# Patient Record
Sex: Female | Born: 1980 | Race: White | Hispanic: No | Marital: Single | Smoking: Former smoker
Health system: Southern US, Community
[De-identification: ages and names within clinical notes are randomized; demographics above are authoritative.]

## PROBLEM LIST (undated history)

## (undated) DIAGNOSIS — I4949 Other premature depolarization: Secondary | ICD-10-CM

## (undated) DIAGNOSIS — G90A Postural orthostatic tachycardia syndrome (POTS): Secondary | ICD-10-CM

## (undated) DIAGNOSIS — I499 Cardiac arrhythmia, unspecified: Secondary | ICD-10-CM

## (undated) DIAGNOSIS — R55 Syncope and collapse: Secondary | ICD-10-CM

## (undated) DIAGNOSIS — F341 Dysthymic disorder: Secondary | ICD-10-CM

## (undated) DIAGNOSIS — I498 Other specified cardiac arrhythmias: Secondary | ICD-10-CM

## (undated) DIAGNOSIS — G43009 Migraine without aura, not intractable, without status migrainosus: Secondary | ICD-10-CM

## (undated) DIAGNOSIS — J029 Acute pharyngitis, unspecified: Secondary | ICD-10-CM

## (undated) DIAGNOSIS — J019 Acute sinusitis, unspecified: Secondary | ICD-10-CM

## (undated) HISTORY — DX: Other specified cardiac arrhythmias: I49.8

## (undated) HISTORY — DX: Migraine without aura, not intractable, without status migrainosus: G43.009

## (undated) HISTORY — DX: Dysthymic disorder: F34.1

## (undated) HISTORY — DX: Postural orthostatic tachycardia syndrome (POTS): G90.A

## (undated) HISTORY — DX: Syncope and collapse: R55

## (undated) HISTORY — DX: Other premature depolarization: I49.49

## (undated) HISTORY — DX: Acute sinusitis, unspecified: J01.90

## (undated) HISTORY — PX: KNEE ARTHROSCOPY: SHX127

## (undated) HISTORY — DX: Acute pharyngitis, unspecified: J02.9

## (undated) HISTORY — DX: Cardiac arrhythmia, unspecified: I49.9

---
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
## 2002-11-03 ENCOUNTER — Emergency Department (HOSPITAL_COMMUNITY): Admission: EM | Admit: 2002-11-03 | Discharge: 2002-11-04 | Payer: Self-pay | Admitting: Emergency Medicine

## 2004-03-03 ENCOUNTER — Emergency Department (HOSPITAL_COMMUNITY): Admission: EM | Admit: 2004-03-03 | Discharge: 2004-03-03 | Payer: Self-pay | Admitting: *Deleted

## 2004-05-19 ENCOUNTER — Inpatient Hospital Stay (HOSPITAL_COMMUNITY): Admission: AD | Admit: 2004-05-19 | Discharge: 2004-05-19 | Payer: Self-pay | Admitting: Obstetrics & Gynecology

## 2004-05-21 ENCOUNTER — Inpatient Hospital Stay (HOSPITAL_COMMUNITY): Admission: AD | Admit: 2004-05-21 | Discharge: 2004-05-21 | Payer: Self-pay | Admitting: *Deleted

## 2004-06-26 ENCOUNTER — Inpatient Hospital Stay (HOSPITAL_COMMUNITY): Admission: AD | Admit: 2004-06-26 | Discharge: 2004-06-26 | Payer: Self-pay | Admitting: Obstetrics and Gynecology

## 2004-06-29 ENCOUNTER — Inpatient Hospital Stay (HOSPITAL_COMMUNITY): Admission: AD | Admit: 2004-06-29 | Discharge: 2004-06-29 | Payer: Self-pay | Admitting: Family Medicine

## 2004-07-10 ENCOUNTER — Inpatient Hospital Stay (HOSPITAL_COMMUNITY): Admission: AD | Admit: 2004-07-10 | Discharge: 2004-07-10 | Payer: Self-pay | Admitting: Obstetrics and Gynecology

## 2004-10-03 ENCOUNTER — Inpatient Hospital Stay (HOSPITAL_COMMUNITY): Admission: AD | Admit: 2004-10-03 | Discharge: 2004-10-03 | Payer: Self-pay | Admitting: Obstetrics & Gynecology

## 2004-10-05 ENCOUNTER — Inpatient Hospital Stay (HOSPITAL_COMMUNITY): Admission: AD | Admit: 2004-10-05 | Discharge: 2004-10-05 | Payer: Self-pay | Admitting: *Deleted

## 2004-10-10 ENCOUNTER — Inpatient Hospital Stay (HOSPITAL_COMMUNITY): Admission: AD | Admit: 2004-10-10 | Discharge: 2004-10-11 | Payer: Self-pay | Admitting: *Deleted

## 2004-10-17 ENCOUNTER — Inpatient Hospital Stay (HOSPITAL_COMMUNITY): Admission: RE | Admit: 2004-10-17 | Discharge: 2004-10-17 | Payer: Self-pay | Admitting: Obstetrics and Gynecology

## 2004-11-11 ENCOUNTER — Other Ambulatory Visit: Admission: RE | Admit: 2004-11-11 | Discharge: 2004-11-11 | Payer: Self-pay | Admitting: Obstetrics & Gynecology

## 2004-11-12 ENCOUNTER — Ambulatory Visit (HOSPITAL_COMMUNITY): Admission: RE | Admit: 2004-11-12 | Discharge: 2004-11-12 | Payer: Self-pay | Admitting: Obstetrics & Gynecology

## 2004-11-12 ENCOUNTER — Other Ambulatory Visit: Admission: RE | Admit: 2004-11-12 | Discharge: 2004-11-12 | Payer: Self-pay | Admitting: Obstetrics & Gynecology

## 2004-11-12 ENCOUNTER — Encounter (INDEPENDENT_AMBULATORY_CARE_PROVIDER_SITE_OTHER): Payer: Self-pay | Admitting: *Deleted

## 2005-03-24 ENCOUNTER — Inpatient Hospital Stay (HOSPITAL_COMMUNITY): Admission: AD | Admit: 2005-03-24 | Discharge: 2005-03-25 | Payer: Self-pay | Admitting: Obstetrics and Gynecology

## 2005-04-14 ENCOUNTER — Inpatient Hospital Stay (HOSPITAL_COMMUNITY): Admission: AD | Admit: 2005-04-14 | Discharge: 2005-04-14 | Payer: Self-pay | Admitting: Obstetrics and Gynecology

## 2005-06-01 ENCOUNTER — Inpatient Hospital Stay (HOSPITAL_COMMUNITY): Admission: AD | Admit: 2005-06-01 | Discharge: 2005-06-01 | Payer: Self-pay | Admitting: Obstetrics and Gynecology

## 2005-08-30 ENCOUNTER — Inpatient Hospital Stay (HOSPITAL_COMMUNITY): Admission: AD | Admit: 2005-08-30 | Discharge: 2005-08-30 | Payer: Self-pay | Admitting: Obstetrics and Gynecology

## 2005-09-17 ENCOUNTER — Observation Stay (HOSPITAL_COMMUNITY): Admission: AD | Admit: 2005-09-17 | Discharge: 2005-09-18 | Payer: Self-pay | Admitting: Internal Medicine

## 2005-10-06 ENCOUNTER — Inpatient Hospital Stay (HOSPITAL_COMMUNITY): Admission: AD | Admit: 2005-10-06 | Discharge: 2005-10-07 | Payer: Self-pay | Admitting: Obstetrics and Gynecology

## 2005-10-16 ENCOUNTER — Inpatient Hospital Stay (HOSPITAL_COMMUNITY): Admission: AD | Admit: 2005-10-16 | Discharge: 2005-10-19 | Payer: Self-pay | Admitting: Obstetrics & Gynecology

## 2005-10-16 ENCOUNTER — Encounter (INDEPENDENT_AMBULATORY_CARE_PROVIDER_SITE_OTHER): Payer: Self-pay | Admitting: Specialist

## 2006-05-28 ENCOUNTER — Emergency Department (HOSPITAL_COMMUNITY): Admission: EM | Admit: 2006-05-28 | Discharge: 2006-05-28 | Payer: Self-pay | Admitting: Emergency Medicine

## 2006-06-10 ENCOUNTER — Emergency Department (HOSPITAL_COMMUNITY): Admission: EM | Admit: 2006-06-10 | Discharge: 2006-06-10 | Payer: Self-pay | Admitting: Emergency Medicine

## 2006-10-11 ENCOUNTER — Emergency Department: Payer: Self-pay | Admitting: Emergency Medicine

## 2006-10-12 ENCOUNTER — Emergency Department (HOSPITAL_COMMUNITY): Admission: EM | Admit: 2006-10-12 | Discharge: 2006-10-12 | Payer: Self-pay | Admitting: Emergency Medicine

## 2006-10-28 ENCOUNTER — Ambulatory Visit (HOSPITAL_BASED_OUTPATIENT_CLINIC_OR_DEPARTMENT_OTHER): Admission: RE | Admit: 2006-10-28 | Discharge: 2006-10-28 | Payer: Self-pay | Admitting: Otolaryngology

## 2006-12-12 IMAGING — US US OB TRANSVAGINAL
1 series · 14 of 28 positions shown · non-contrast
Comparison: none

CLINICAL DATA: Confirm viability.  Multiple prior spontaneous abortions.  
 TRANSVAGINAL OBSTETRICAL ULTRASOUND:  
 Multiple images of the uterus and adnexa were obtained using an endovaginal approach.
 There is a single intrauterine pregnancy identified that demonstrates an estimated gestational age by crown rump length of 7 weeks and 4 days.  Positive regular fetal cardiac activity with a rate of 157 bpm was noted.  A normal appearing yolk sac is seen and no signs of subchorionic hemorrhage are evident.
 Both ovaries are seen and have a normal appearance with the right ovary measuring 3.6 x 2.0 x 2.0 cm and containing a corpus luteum cyst.  The left ovary measures 2.7 x 1.4 x 1.9 cm and has a normal appearance.  No cul-de-sac or periovarian fluid is seen and no separate adnexal masses are noted.

[Series 1: us ob transvaginal · 0.21mm/px · 14 of 52 slices shown]
[im 2/52]
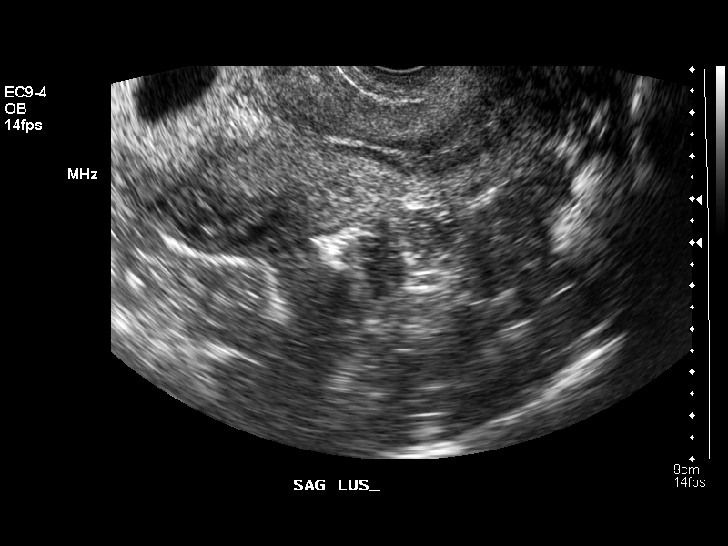
[im 6/52]
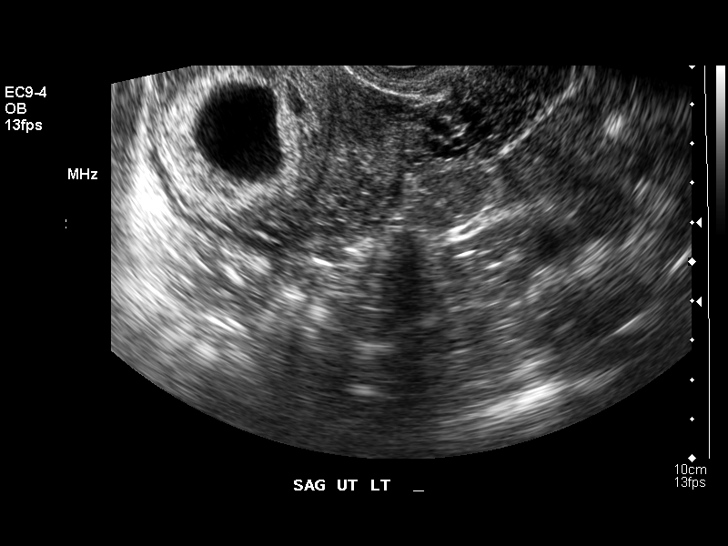
[im 10/52]
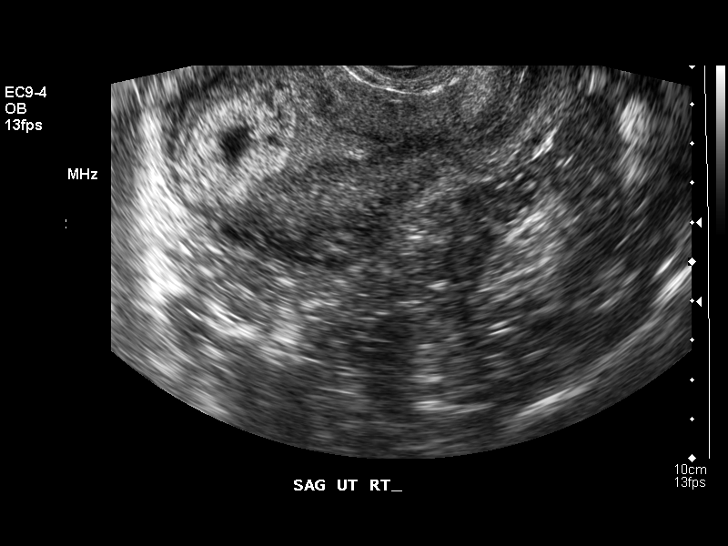
[im 14/52]
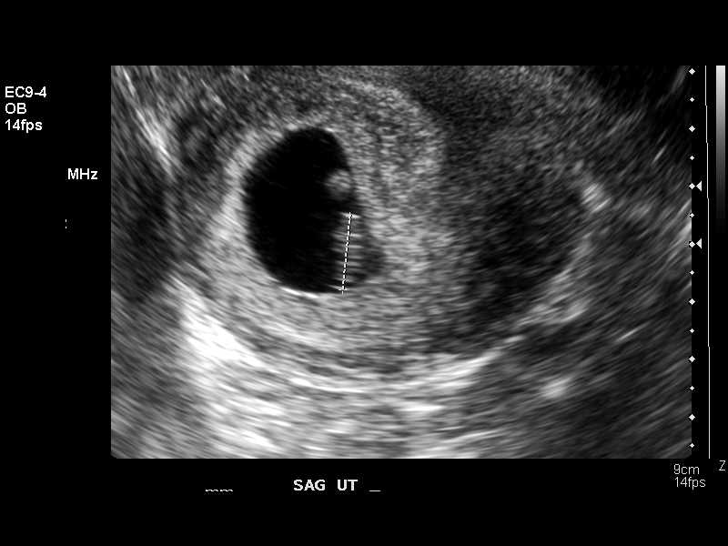
[im 18/52]
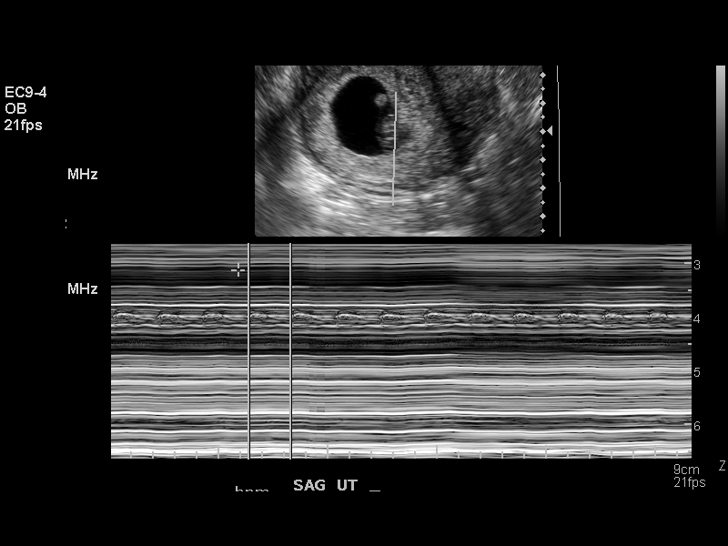
[im 21/52]
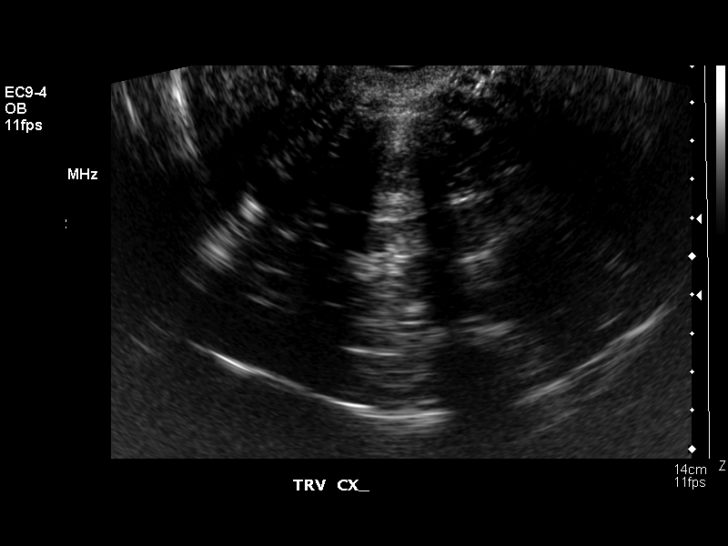
[im 25/52]
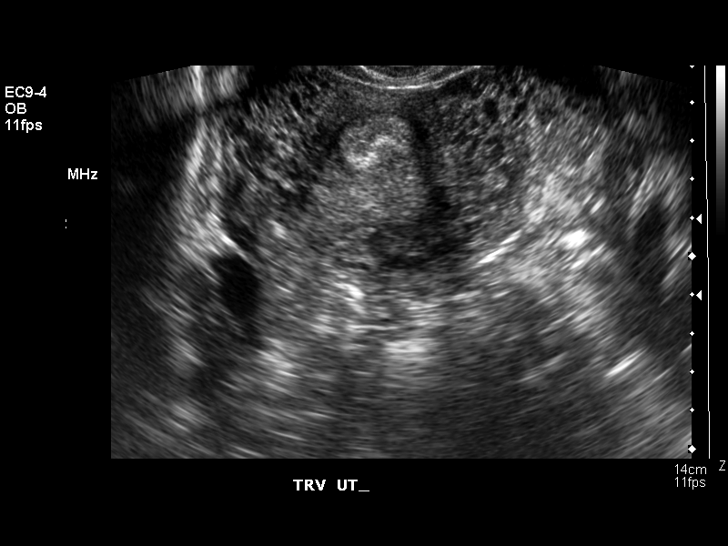
[im 29/52]
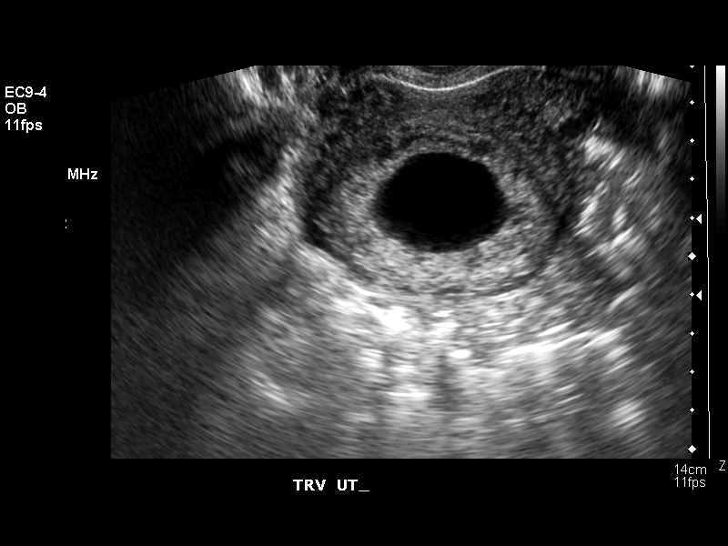
[im 33/52]
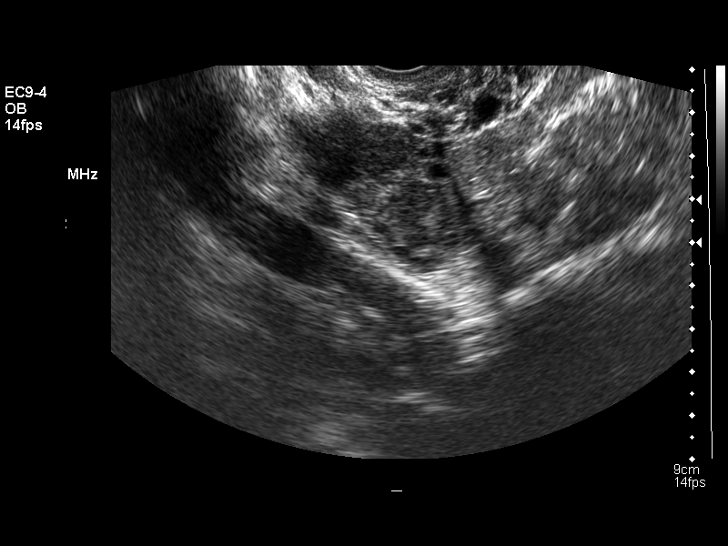
[im 36/52]
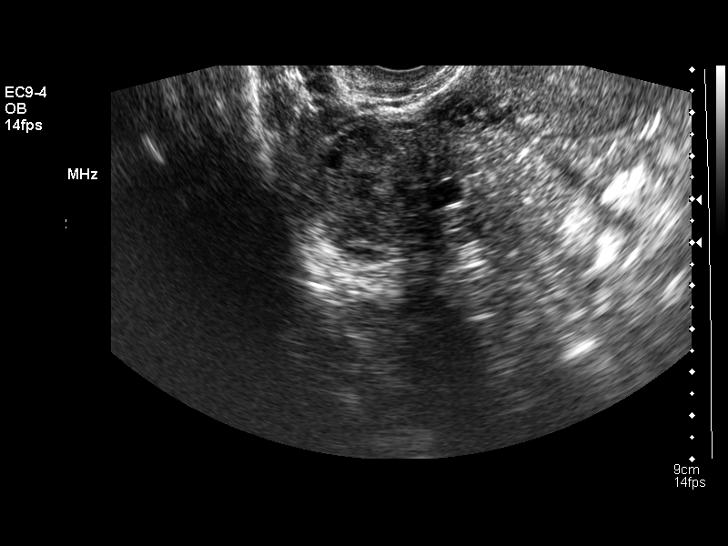
[im 40/52]
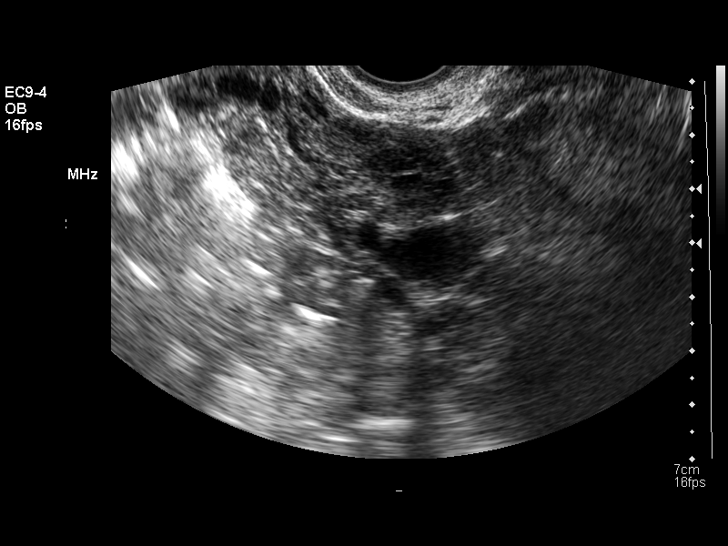
[im 44/52]
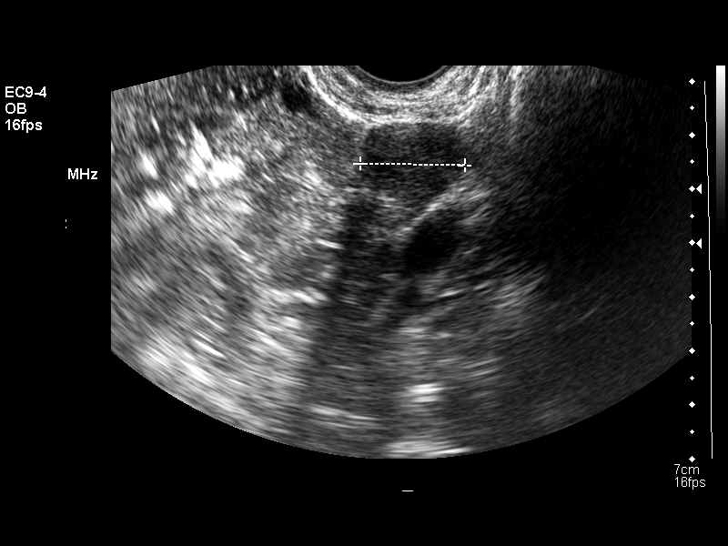
[im 48/52]
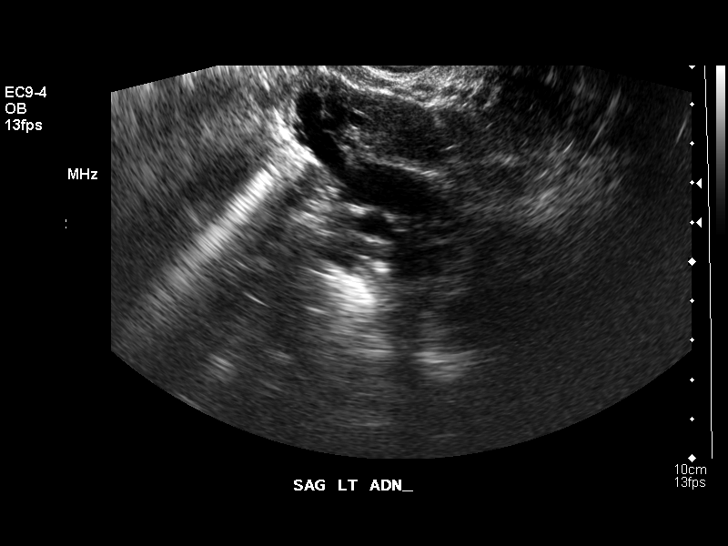
[im 52/52]
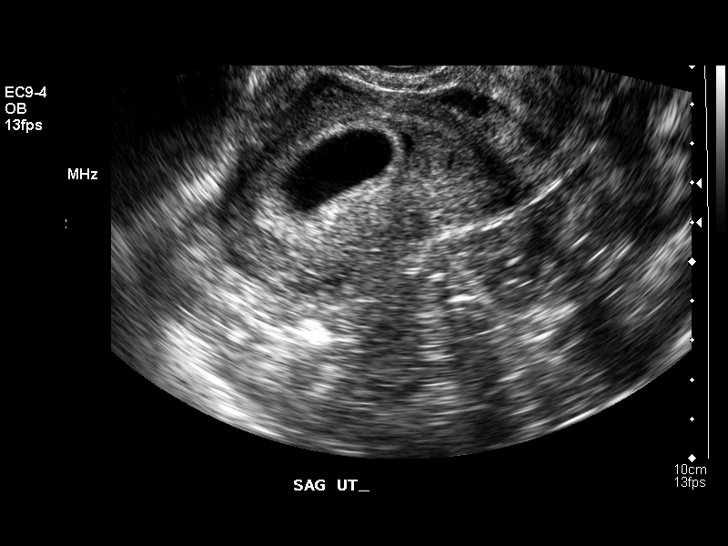

[14 of 28 positions shown; findings below may reference images not displayed]

IMPRESSION: 7 week 4 day living intrauterine pregnancy.  Normal ovaries.

## 2007-04-22 HISTORY — PX: OTHER SURGICAL HISTORY: SHX169

## 2008-09-19 ENCOUNTER — Emergency Department (HOSPITAL_COMMUNITY): Admission: EM | Admit: 2008-09-19 | Discharge: 2008-09-19 | Payer: Self-pay | Admitting: Emergency Medicine

## 2008-12-14 ENCOUNTER — Encounter: Admission: RE | Admit: 2008-12-14 | Discharge: 2008-12-14 | Payer: Self-pay | Admitting: Obstetrics and Gynecology

## 2009-01-02 ENCOUNTER — Ambulatory Visit: Payer: Self-pay | Admitting: Internal Medicine

## 2009-01-02 DIAGNOSIS — R55 Syncope and collapse: Secondary | ICD-10-CM | POA: Insufficient documentation

## 2009-01-02 DIAGNOSIS — G43009 Migraine without aura, not intractable, without status migrainosus: Secondary | ICD-10-CM

## 2009-01-02 DIAGNOSIS — F341 Dysthymic disorder: Secondary | ICD-10-CM

## 2009-01-02 HISTORY — DX: Migraine without aura, not intractable, without status migrainosus: G43.009

## 2009-01-02 HISTORY — DX: Syncope and collapse: R55

## 2009-01-02 HISTORY — DX: Dysthymic disorder: F34.1

## 2009-01-09 ENCOUNTER — Ambulatory Visit: Payer: Self-pay

## 2009-01-09 ENCOUNTER — Encounter: Payer: Self-pay | Admitting: Internal Medicine

## 2009-01-12 ENCOUNTER — Ambulatory Visit: Payer: Self-pay | Admitting: Internal Medicine

## 2009-01-12 DIAGNOSIS — I499 Cardiac arrhythmia, unspecified: Secondary | ICD-10-CM

## 2009-01-12 HISTORY — DX: Cardiac arrhythmia, unspecified: I49.9

## 2009-01-15 ENCOUNTER — Ambulatory Visit: Payer: Self-pay

## 2009-02-19 ENCOUNTER — Ambulatory Visit: Payer: Self-pay | Admitting: Internal Medicine

## 2009-02-21 ENCOUNTER — Ambulatory Visit: Payer: Self-pay | Admitting: Internal Medicine

## 2009-02-21 DIAGNOSIS — I4949 Other premature depolarization: Secondary | ICD-10-CM

## 2009-02-21 HISTORY — DX: Other premature depolarization: I49.49

## 2009-06-04 ENCOUNTER — Emergency Department (HOSPITAL_COMMUNITY): Admission: EM | Admit: 2009-06-04 | Discharge: 2009-06-04 | Payer: Self-pay | Admitting: Emergency Medicine

## 2009-07-12 ENCOUNTER — Telehealth: Payer: Self-pay | Admitting: Internal Medicine

## 2009-08-15 ENCOUNTER — Ambulatory Visit: Payer: Self-pay | Admitting: Internal Medicine

## 2009-08-15 DIAGNOSIS — J019 Acute sinusitis, unspecified: Secondary | ICD-10-CM

## 2009-08-15 HISTORY — DX: Acute sinusitis, unspecified: J01.90

## 2010-01-23 ENCOUNTER — Telehealth: Payer: Self-pay | Admitting: Internal Medicine

## 2010-03-23 ENCOUNTER — Ambulatory Visit: Payer: Self-pay | Admitting: Internal Medicine

## 2010-03-23 DIAGNOSIS — J029 Acute pharyngitis, unspecified: Secondary | ICD-10-CM

## 2010-03-23 HISTORY — DX: Acute pharyngitis, unspecified: J02.9

## 2010-03-23 LAB — CONVERTED CEMR LAB: Rapid Strep: NEGATIVE

## 2010-05-19 LAB — CONVERTED CEMR LAB
Alkaline Phosphatase: 46 units/L (ref 39–117)
CO2: 30 meq/L (ref 19–32)
Chloride: 107 meq/L (ref 96–112)
Creatinine, Ser: 0.8 mg/dL (ref 0.4–1.2)
GFR calc non Af Amer: 91.67 mL/min (ref >60)
Glucose, Bld: 105 mg/dL — ABNORMAL HIGH (ref 70–99)
Lymphocytes Relative: 43 % (ref 12.0–46.0)
Lymphs Abs: 2.4 10*3/uL (ref 0.7–4.0)
MCHC: 33.8 g/dL (ref 30.0–36.0)
MCV: 97.5 fL (ref 78.0–100.0)
Monocytes Relative: 8.3 % (ref 3.0–12.0)
Neutrophils Relative %: 47.8 % (ref 43.0–77.0)
Potassium: 4.1 meq/L (ref 3.5–5.1)
TSH: 1.06 microintl units/mL (ref 0.35–5.50)
Total Protein: 8.1 g/dL (ref 6.0–8.3)
WBC: 5.5 10*3/uL (ref 4.5–10.5)

## 2010-05-21 NOTE — Progress Notes (Signed)
Summary: alprazolam  Phone Note Call from Patient Call back at Home Phone [phone]   Caller: Patient Reason for Call: Refill Medication Summary of Call: Pt is requesting refill on Alprazolam to be sent to [facility] Initial call taken by: Brenton Grills MA,  January 23, 2010 4:50 PM    New/Updated Medications: ALPRAZOLAM 0.25 MG TABS (ALPRAZOLAM) 1 by mouth once daily as needed Prescriptions: ALPRAZOLAM 0.25 MG TABS (ALPRAZOLAM) 1 by mouth once daily as needed  #30 x 2   Entered and Authorized by:   Corwin Levins MD   Signed by:   Corwin Levins MD on 01/23/2010   Method used:   Print then Give to Patient   RxID:   [id number]  done hardcopy to LIM side B - dahlia Corwin Levins MD  January 23, 2010 5:46 PM   Rx faxed to pharmacy Margaret Pyle, CMA  January 24, 2010 7:55 AM

## 2010-05-21 NOTE — Assessment & Plan Note (Signed)
Summary: COLD SYMPTOMS---STC   Vital Signs:  Patient profile:   30 year old female Height:      67 inches Weight:      120 pounds BMI:     18.86 O2 Sat:      97 % on Room air Temp:     98.1 degrees F oral Pulse rate:   82 / minute BP sitting:   100 / 58  (left arm) Cuff size:   regular  Vitals Entered ByZella Ball Ewing (August 15, 2009 4:36 PM)  O2 Flow:  Room air CC: congestion, ear pain, sore throat/RE   Primary Care Provider:  Dr. Jonny Ruiz has not seen in 5 years  CC:  congestion, ear pain, and sore throat/RE.  History of Present Illness: here with 3 days onset facial pain, pressure, fever , and greenish d/c with mild ST, but Pt denies CP, sob, doe, wheezing, orthopnea, pnd, worsening LE edema, palps, dizziness or syncope   Pt denies new neuro symptoms such as headache, facial or extremity weakness     Problems Prior to Update: 1)  Sinusitis- Acute-nos  (ICD-461.9) 2)  Premature Ventricular Contractions  (ICD-427.69) 3)  Cardiac Arrhythmia  (ICD-427.9) 4)  Common Migraine  (ICD-346.10) 5)  Anxiety Depression  (ICD-300.4) 6)  Syncope  (ICD-780.2)  Medications Prior to Update: 1)  Sertraline Hcl 50 Mg Tabs (Sertraline Hcl) .Marland Kitchen.. 1 By Mouth Once Daily 2)  Alprazolam 0.25 Mg Tabs (Alprazolam) .Marland Kitchen.. 1 By Mouth Once Daily As Needed 3)  Azithromycin 250 Mg Tabs (Azithromycin) .... 2po Qd For 1 Day, Then 1po Qd For 4days, Then Stop  Current Medications (verified): 1)  Sertraline Hcl 50 Mg Tabs (Sertraline Hcl) .Marland Kitchen.. 1 By Mouth Once Daily 2)  Alprazolam 0.25 Mg Tabs (Alprazolam) .Marland Kitchen.. 1 By Mouth Once Daily As Needed 3)  Cephalexin 500 Mg Caps (Cephalexin) .Marland Kitchen.. 1 By Mouth Three Times A Day 4)  Sudahist 12-120 Mg Xr12h-Tab (Chlorpheniramine-Pseudoeph) .Marland Kitchen.. 1 By Mouth Two Times A Day As Needed  Allergies (verified): 1)  ! * Avelox  Past History:  Past Medical History: Last updated: 01/02/2009 migraine  Past Surgical History: Last updated: 01/02/2009 s/p rhinoplasty  2009  Social History: Last updated: 01/02/2009 Single 2 children - twins work - Building services engineer support Former Smoker Alcohol use-yes - rare  Risk Factors: Smoking Status: quit (01/02/2009)  Review of Systems       all otherwise negative per pt -    Physical Exam  General:  alert and well-developed.  , mild ill  Head:  normocephalic and atraumatic.   Eyes:  vision grossly intact, pupils equal, and pupils round.   Ears:  bilat tm's red, sinus tender bilat  Nose:  nasal dischargemucosal pallor and mucosal edema.   Mouth:  pharyngeal erythema and fair dentition.   Neck:  supple and cervical lymphadenopathy.   Lungs:  normal respiratory effort and normal breath sounds.   Heart:  normal rate and regular rhythm.   Extremities:  no edema, no erythema    Impression & Recommendations:  Problem # 1:  SINUSITIS- ACUTE-NOS (ICD-461.9)  Her updated medication list for this problem includes:    Cephalexin 500 Mg Caps (Cephalexin) .Marland Kitchen... 1 by mouth three times a day    Sudahist 12-120 Mg Xr12h-tab (Chlorpheniramine-pseudoeph) .Marland Kitchen... 1 by mouth two times a day as needed treat as above, f/u any worsening signs or symptoms   Complete Medication List: 1)  Sertraline Hcl 50 Mg Tabs (Sertraline hcl) .Marland Kitchen.. 1 by mouth once  daily 2)  Alprazolam 0.25 Mg Tabs (Alprazolam) .Marland Kitchen.. 1 by mouth once daily as needed 3)  Cephalexin 500 Mg Caps (Cephalexin) .Marland Kitchen.. 1 by mouth three times a day 4)  Sudahist 12-120 Mg Xr12h-tab (Chlorpheniramine-pseudoeph) .Marland Kitchen.. 1 by mouth two times a day as needed  Patient Instructions: 1)  Please take all new medications as prescribed 2)  Continue all previous medications as before this visit  3)  Please schedule a follow-up appointment as needed. Prescriptions: SUDAHIST 12-120 MG XR12H-TAB (CHLORPHENIRAMINE-PSEUDOEPH) 1 by mouth two times a day as needed  #60 x 1   Entered and Authorized by:   Corwin Levins MD   Signed by:   Corwin Levins MD on 08/15/2009   Method used:    Print then Give to Patient   RxID:   [id number] CEPHALEXIN 500 MG CAPS (CEPHALEXIN) 1 by mouth three times a day  #30 x 0   Entered and Authorized by:   Corwin Levins MD   Signed by:   Corwin Levins MD on 08/15/2009   Method used:   Print then Give to Patient   RxID:   [id number]

## 2010-05-21 NOTE — Progress Notes (Signed)
Summary: Med Refill  Phone Note Refill Request  on July 12, 2009 2:17 PM  Refills Requested: Medication #1:  ALPRAZOLAM 0.25 MG TABS 1 by mouth once daily as needed-rarely   Dosage confirmed as above?Dosage Confirmed   Notes: [facility] [phone] Initial call taken by: Scharlene Gloss,  July 12, 2009 2:17 PM  Follow-up for Phone Call        RX faxed to pharmacy Follow-up by: Margaret Pyle, CMA,  July 13, 2009 8:11 AM    New/Updated Medications: ALPRAZOLAM 0.25 MG TABS (ALPRAZOLAM) 1 by mouth once daily as needed Prescriptions: ALPRAZOLAM 0.25 MG TABS (ALPRAZOLAM) 1 by mouth once daily as needed  #30 x 2   Entered and Authorized by:   Corwin Levins MD   Signed by:   Corwin Levins MD on 07/12/2009   Method used:   Print then Give to Patient   RxID:   [id number]  done hardcopy to LIM side B - dahlia  Corwin Levins MD  July 12, 2009 5:53 PM

## 2010-05-21 NOTE — Assessment & Plan Note (Signed)
Summary: SORE THROAT/STREP/DLO   Vital Signs:  Patient profile:   30 year old female Weight:      118 pounds BMI:     18.55 Temp:     97.5 degrees F oral Pulse rate:   77 / minute BP sitting:   90 / 58  (left arm) Cuff size:   regular  Vitals Entered By: Lamar Sprinkles, CMA (March 23, 2010 9:57 AM) CC: severe sore throat Comments Pt has stopped gen zoloft/SD   History of Present Illness: CC: ST  3d h/o bad ST, mild congestion.  Hsa tried nothing so far.  Getting plenty of fluid.  No fevers/chills, no coughing.  No HA, abd pain, n/v, rashes, myalgias, arthralgias.  son has viral cold, neg strep  self titrated off zoloft - didn't like jittery feeling.  slowly backed off xanax as well.  Current Medications (verified): 1)  Sertraline Hcl 50 Mg Tabs (Sertraline Hcl) .Marland Kitchen.. 1 By Mouth Once Daily 2)  Alprazolam 0.25 Mg Tabs (Alprazolam) .Marland Kitchen.. 1 By Mouth Once Daily As Needed  Allergies (verified): 1)  ! * Avelox  Past History:  Past Medical History: Last updated: 01/02/2009 migraine  Social History: Last updated: 01/02/2009 Single 2 children - twins work - Building services engineer support Former Smoker Alcohol use-yes - rare  Review of Systems       per HPI  Physical Exam  General:  alert and well-developed.  tired Head:  normocephalic and atraumatic.    sinuses NT Eyes:  vision grossly intact, pupils equal, and pupils round.   Ears:  Tms clear bilaterally Nose:  nares clear Mouth:  pharyngeal erythema and fair dentition.  no exudates  Neck:  supple and tender R AC LAD Lungs:  normal respiratory effort and normal breath sounds.   Heart:  normal rate and regular rhythm.   Abdomen:  soft, non-tender, and normal bowel sounds.   Pulses:  2+ rad pulses Extremities:  no edema, no erythema  Skin:  Intact without suspicious lesions or rashes   Impression & Recommendations:  Problem # 1:  ACUTE PHARYNGITIS (ICD-462) rapid strep neg, low centor criteria.  treat  supportively.  red flags to return discussed The following medications were removed from the medication list:    Cephalexin 500 Mg Caps (Cephalexin) .Marland Kitchen... 1 by mouth three times a day  Orders: Rapid Strep (54098)  Complete Medication List: 1)  Sertraline Hcl 50 Mg Tabs (Sertraline hcl) .Marland Kitchen.. 1 by mouth once daily 2)  Alprazolam 0.25 Mg Tabs (Alprazolam) .Marland Kitchen.. 1 by mouth once daily as needed  Patient Instructions: 1)  Looks like a viral infection.  Wash hands to prevent spreading. 2)  Push fluids, get plenty of rest, ibuprofen (motrin) for sore throat.  Suck on cold things like popsicles, or heat to soothe throat like herbal teas, consider salt water gargles. 3)  If fever >101.5, trouble swallowing or breathing or opening mouth, drooling, or other concerns, you may need to return to be seen. 4)  Pleasure to see you today, call clinic with questions.    Orders Added: 1)  Est. Patient Level III [11914] 2)  Rapid Strep [78295]    Laboratory Results    Other Tests  Rapid Strep: negative

## 2010-07-29 LAB — RAPID STREP SCREEN (MED CTR MEBANE ONLY): Streptococcus, Group A Screen (Direct): POSITIVE — AB
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
## 2010-09-06 NOTE — Op Note (Signed)
NAMEMARILEE, Kathy Mccoy               ACCOUNT NO.:  [id number]   MEDICAL RECORD NO.:  [id number]          PATIENT TYPE:  AMB   LOCATION:  SDC                           [facility]   PHYSICIAN:  Ilda Mori, M.D.   DATE OF BIRTH:  September 26, 1980   DATE OF PROCEDURE:  11/12/2004  DATE OF DISCHARGE:                                 OPERATIVE REPORT   PREOPERATIVE DIAGNOSIS:  Missed abortion.   POSTOPERATIVE DIAGNOSIS:  Missed abortion.   PROCEDURE:  Dilatation and evacuation.   SURGEON:  Ilda Mori, M.D.   ANESTHESIA:  Paracervical block with IV sedation.   ESTIMATED BLOOD LOSS:  10 mL.   FINDINGS:  Products of conception.   SPECIMENS:  Products of conception to pathology and portion of products of  conception for chromosomal analysis.   COMPLICATIONS:  None.   INDICATIONS:  This is a 30 year old gravida 4, AB 3, who presented to the  office at 1 weeks' gestation and was found to have an intrauterine fetal  demise of approximately 10 weeks' size.  The findings were confirmed with a  second ultrasound, and I discussed with the patient, who elected to proceed  with dilatation and evacuation.   PROCEDURE:  The patient was taken to the operating room and placed in supine  position.  IV sedation was administered.  She was placed in the dorsal  lithotomy position.  The vagina and vulva were prepped and draped in a  sterile fashion and a speculum was placed in the vagina.  The cervix was  identified.  Lidocaine 1% 20 mL was infiltrated in the paracervical tissues.  The anterior lip of the cervix grasped with a single-tooth tenaculum.  The  internal os was dilated with Shawnie Pons dilators to 52 [place].  A #9 suction  curette was introduced into the endometrial cavity and the products of  conception were evacuated.  The specimen was then evaluated to see if fetal  parts could be sent for chromosomal analysis, and no fetal parts could be  seen.  A second exploration of the uterine  cavity was performed with a #7  suction curette and there was no remaining tissue in the cavity, and the  cavity was felt to be  thoroughly explored.  A specimen of placental tissue was sent for a  chromosomal analysis, and it was felt that the fetal tissue was so autolyzed  from the previous demise that during the suction, it became unidentifiable.  At this point the procedure was terminated.  The patient left the operating  room in good condition.       RK/MEDQ  D:  11/12/2004  T:  11/12/2004  Job:  [id number]

## 2010-09-06 NOTE — Op Note (Signed)
Kathy Mccoy, CHRISTON NO.:  [id number]   MEDICAL RECORD NO.:  [id number]          PATIENT TYPE:  INP   LOCATION:  9125                          [facility]ACILITY:  [facility]   PHYSICIAN:  Malva Limes, M.D.    DATE OF BIRTH:  1981-03-20   DATE OF PROCEDURE:  10/16/2005  DATE OF DISCHARGE:  10/19/2005                                 OPERATIVE REPORT   HISTORY OF PRESENT ILLNESS:  [name] is a 30 year old white female, G-4, P-0-  0-3-0 at 36-1/2 weeks estimated gestational age, who presented to Labor and  Delivery with a twin pregnancy, in active labor.  At the time of admission,  the patient was 3 cm dilated.  The presentation of the babies were vertex  and breech.  The patient was admitted and amniotomy performed.  She  progressed long and normally over curve without difficulty.  When she  completed the first stage she was taken to the operative room.  She had a  spontaneous vaginal delivery in the vertex presentation over a first degree  midline tear without difficulty.  After this, an ultrasound was used to  locate baby B.  Baby B was in the frank breech presentation.  At this point  both feet was grasped.  The amniotic sac ruptured.  Fluid was clear.  A  breech extraction was performed without difficulty.  Once this was  accomplished, both infants were handed to the NICU team and then taken to  newborn nursery.  Cord was obtained.  Placenta was spontaneously delivered  and sent to Pathology.  The first great care was closed with 3-0 chromic in  the usual fashion.  The patient was taken to recovery room in stable  condition.           ______________________________  Malva Limes, M.D.     MA/MEDQ  D:  11/10/2005  T:  11/10/2005  Job:  [id number]

## 2010-09-06 NOTE — Discharge Summary (Signed)
[name], Kathy Mccoy               ACCOUNT NO.:  [id number]   MEDICAL RECORD NO.:  [id number]          PATIENT TYPE:  INP   LOCATION:  9125                          [facility]ILITY[facility]   PHYSICIAN:  Randye Lobo, M.D.   DATE OF BIRTH:  [birth year]   DATE OF ADMISSION:  10/16/2005  DATE OF DISCHARGE:  10/19/2005                                 DISCHARGE SUMMARY   FINAL DIAGNOSIS:  Intrauterine twin gestation at 36-1/2 weeks' gestation,  vertex and breech presentation, active labor, vaginal deliveries performed  by Dr. Malva Limes.   COMPLICATIONS:  None.   HISTORY:  This 30 year old G30, P-0-0-3-0 presents at 36-1/2-weeks' gestation  with twin pregnancy in active labor.  The patient's antepartum course up to  this point had been complicated by a history of three spontaneous  miscarriages and some preterm contractions.  The patient was on Procardia up  until about 35-[redacted] weeks gestation.  Otherwise, her antepartum course had  been uncomplicated.  The patient was about 3 cm dilated at time of  admission.  The babies were still in a vertex breech presentation.  Amniotomy was performed.  The patient had dilated to complete.  She had  spontaneous vaginal delivery of baby A in the vertex position.  Baby A  weighed 5 pounds 0 ounces with Apgars of 9 and 9.  There was a first degree  midline tear that was present, and ultrasound was then used to locate baby B  which was in a frank breech presentation.  At this point, both feet were  grasped by Dr. Dareen Piano.  The amniotic sac was ruptured, and a breech  extraction was performed without difficulty.  Baby B weighed 5 pounds 5  ounces and was a female infant with Apgars of 6 and 9.  The delivery went  without complications.  The NICU team was there, but the babies needed just  to go to the newborn nursery.  The delivery went without complications.  The  patient's postpartum course was benign without any significant fevers.  There were some feeding  issues, and therefore the patient was kept in-house  until postpartum day #3.  She was felt ready for discharge at this time.  She was sent home on a regular diet with decrease activities, told to  continue her prenatal vitamins, could use ibuprofen up to 600 mg every 6  hours as needed for pain, was to call of course with any increased bleeding,  pain or problems.  The patient is a single mother but did have some help  with care of twins.   DISCHARGE LABORATORY DATA:  The patient had a hemoglobin of 12.4, white  blood cell count of 11.2, platelets of 156,000 and a normal PIH panel with  only a slightly elevated uric acid to 7.18.      Kathy Mccoy, P.A.-C.      Randye Lobo, M.D.  Electronically Signed    MB/MEDQ  D:  12/08/2005  T:  12/08/2005  Job:  [id number]
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
## 2010-11-07 ENCOUNTER — Ambulatory Visit (INDEPENDENT_AMBULATORY_CARE_PROVIDER_SITE_OTHER): Payer: 59 | Admitting: Internal Medicine

## 2010-11-07 ENCOUNTER — Encounter: Payer: Self-pay | Admitting: Internal Medicine

## 2010-11-07 DIAGNOSIS — H6091 Unspecified otitis externa, right ear: Secondary | ICD-10-CM | POA: Insufficient documentation

## 2010-11-07 DIAGNOSIS — Z01810 Encounter for preprocedural cardiovascular examination: Secondary | ICD-10-CM

## 2010-11-07 DIAGNOSIS — J019 Acute sinusitis, unspecified: Secondary | ICD-10-CM

## 2010-11-07 DIAGNOSIS — F341 Dysthymic disorder: Secondary | ICD-10-CM

## 2010-11-07 DIAGNOSIS — H60399 Other infective otitis externa, unspecified ear: Secondary | ICD-10-CM

## 2010-11-07 DIAGNOSIS — Z0000 Encounter for general adult medical examination without abnormal findings: Secondary | ICD-10-CM

## 2010-11-07 HISTORY — DX: Encounter for preprocedural cardiovascular examination: Z01.810

## 2010-11-07 MED ORDER — AZITHROMYCIN 250 MG PO TABS: ORAL_TABLET | ORAL | Status: AC

## 2010-11-07 MED ORDER — NEOMYCIN-POLYMYXIN-HC 3.5-10000-1 OT SUSP: 3.0000 [drp] | Freq: Four times a day (QID) | OTIC | Status: AC

## 2010-11-07 MED ORDER — ALPRAZOLAM 0.25 MG PO TABS: 0.2500 mg | ORAL_TABLET | Freq: Every day | ORAL | Status: AC | PRN

## 2010-11-07 NOTE — Assessment & Plan Note (Signed)
Mild to mod, for antibx course,  to f/u any worsening symptoms or concerns 

## 2010-11-07 NOTE — Progress Notes (Signed)
  Subjective:    Patient ID: Kathy Mccoy, female    DOB: 08-19-1980, 30 y.o.   MRN: [id number]  HPI  Here with 3 days acute onset fever, facial pain, pressure, general weakness and malaise, and greenish d/c, with slight ST, but little to no cough and Pt denies chest pain, increased sob or doe, wheezing, orthopnea, PND, increased LE swelling, palpitations, dizziness or syncope. Also incidentally with 2-3 days right ear pain, swelling, tender without hearing loss, trauma, or d/c.  Denies worsening depressive symptoms, suicidal ideation, or panic, though has ongoing anxiety, not increased recently, though did try the zoloft at 50 mg that seemed to make her morejittery to was stopped.  Currently using minimal xanax more for near-panic only. Past Medical History  Diagnosis Date  . Acute pharyngitis 03/23/2010  . ANXIETY DEPRESSION 01/02/2009  . CARDIAC ARRHYTHMIA 01/12/2009  . COMMON MIGRAINE 01/02/2009  . PREMATURE VENTRICULAR CONTRACTIONS 02/21/2009  . SINUSITIS- ACUTE-NOS 08/15/2009  . SYNCOPE 01/02/2009   Past Surgical History  Procedure Date  . S/p rhinoplasty 2009    reports that she has quit smoking. She does not have any smokeless tobacco history on file. She reports that she drinks alcohol. Her drug history not on file. family history is not on file. Allergies  Allergen Reactions  . Moxifloxacin Rash  . Zoloft Anxiety   No current outpatient prescriptions on file prior to visit.   Review of Systems Review of Systems  Constitutional: Negative for diaphoresis and unexpected weight change.  HENT: Negative for drooling and tinnitus.   Eyes: Negative for photophobia and visual disturbance.  Respiratory: Negative for choking and stridor.      Objective:   Physical Exam BP 92/62  Pulse 85  Temp(Src) 98.3 F (36.8 C) (Oral)  Ht 5\' 7"  (1.702 m)  Wt 115 lb 8 oz (52.39 kg)  BMI 18.09 kg/m2  SpO2 94% Physical Exam  VS noted, mild ill  Constitutional: Pt appears well-developed and  well-nourished.  HENT: Head: Normocephalic.  Right Ear: External ear with marked tender, but mild swelling/erythema, no d/c.  Left Ear: External ear normal.  Bilat tm's mild erythema.  Sinus tender bilat.  Pharynx mild erythema Eyes: Conjunctivae and EOM are normal. Pupils are equal, round, and reactive to light.  Neck: Normal range of motion. Neck supple.  Cardiovascular: Normal rate and regular rhythm.   Pulmonary/Chest: Effort normal and breath sounds normal.  Neurological: Pt is alert. No cranial nerve deficit.  Skin: Skin is warm. No erythema.  Psychiatric: Pt behavior is normal. Thought content normal. 1-2+ nervous        Assessment & Plan:

## 2010-11-07 NOTE — Assessment & Plan Note (Signed)
Mild to mod, for xanax prn rare use,  to f/u any worsening symptoms or concerns

## 2010-11-07 NOTE — Patient Instructions (Signed)
Take all new medications as prescribed Continue all other medications as before  

## 2010-12-10 ENCOUNTER — Other Ambulatory Visit: Payer: Self-pay | Admitting: Internal Medicine

## 2011-02-04 LAB — POCT HEMOGLOBIN-HEMACUE
Hemoglobin: 14.1
Operator id: 208731

## 2011-08-07 ENCOUNTER — Encounter: Payer: Self-pay | Admitting: Internal Medicine

## 2011-08-07 ENCOUNTER — Ambulatory Visit (INDEPENDENT_AMBULATORY_CARE_PROVIDER_SITE_OTHER): Payer: 59 | Admitting: Internal Medicine

## 2011-08-07 VITALS — BP 98/62 | HR 79 | Temp 99.2°F | Ht 67.0 in | Wt 119.2 lb

## 2011-08-07 DIAGNOSIS — J019 Acute sinusitis, unspecified: Secondary | ICD-10-CM

## 2011-08-07 MED ORDER — HYDROCODONE-HOMATROPINE 5-1.5 MG/5ML PO SYRP: 5.0000 mL | ORAL_SOLUTION | Freq: Four times a day (QID) | ORAL | Status: AC | PRN

## 2011-08-07 MED ORDER — CLARITHROMYCIN 500 MG PO TABS: 500.0000 mg | ORAL_TABLET | Freq: Two times a day (BID) | ORAL | Status: AC

## 2011-08-07 MED ORDER — LEVOFLOXACIN 250 MG PO TABS: 250.0000 mg | ORAL_TABLET | Freq: Every day | ORAL | Status: DC

## 2011-08-07 NOTE — Patient Instructions (Addendum)
Take all new medications as prescribed Continue all other medications as before You can also take Delsym OTC for cough, and/or Mucinex (or it's generic off brand) for congestion  

## 2011-08-07 NOTE — Progress Notes (Signed)
  Subjective:    Patient ID: Kathy Mccoy, female    DOB: 06/02/1980, 31 y.o.   MRN: [id number]  HPI    Here with 3 days acute onset fever, facial pain, pressure, general weakness and malaise, and greenish d/c, with slight ST, with significant but essentialy nonprod cough liekly due to post nasal gtt, keeps her from sleeping, and Pt denies chest pain, increased sob or doe, wheezing, orthopnea, PND, increased LE swelling, palpitations, dizziness or syncope.    Not pregnant, has IUD for > 5 yrs, not currently sexually active Past Medical History  Diagnosis Date  . Acute pharyngitis 03/23/2010  . ANXIETY DEPRESSION 01/02/2009  . CARDIAC ARRHYTHMIA 01/12/2009  . COMMON MIGRAINE 01/02/2009  . PREMATURE VENTRICULAR CONTRACTIONS 02/21/2009  . SINUSITIS- ACUTE-NOS 08/15/2009  . SYNCOPE 01/02/2009   Past Surgical History  Procedure Date  . S/p rhinoplasty 2009    reports that she has quit smoking. She does not have any smokeless tobacco history on file. She reports that she drinks alcohol. Her drug history not on file. family history is not on file. Allergies  Allergen Reactions  . Moxifloxacin Rash  . Zoloft Anxiety   Current Outpatient Prescriptions on File Prior to Visit  Medication Sig Dispense Refill  . SUMAtriptan (IMITREX) 100 MG tablet TAKE 1 TABLET BY MOUTH EVERY OTHER DAY AS NEEDED FOR MIGRAINE  9 tablet  1  . ALPRAZolam (XANAX) 0.25 MG tablet Take 1 tablet (0.25 mg total) by mouth daily as needed for anxiety.  30 tablet  1   Review of Systems All otherwise neg per pt    Objective:   Physical Exam BP 98/62  Pulse 79  Temp(Src) 99.2 F (37.3 C) (Oral)  Ht 5\' 7"  (1.702 m)  Wt 119 lb 4 oz (54.091 kg)  BMI 18.68 kg/m2  SpO2 98% Physical Exam  VS noted, mild ill Constitutional: Pt appears well-developed and well-nourished.  HENT: Head: Normocephalic.  Right Ear: External ear normal.  Left Ear: External ear normal.  Bilat tm's mild erythema.  Sinus tender bilat.  Pharynx mild  erythema Eyes: Conjunctivae and EOM are normal. Pupils are equal, round, and reactive to light.  Neck: Normal range of motion. Neck supple.  Cardiovascular: Normal rate and regular rhythm.   Pulmonary/Chest: Effort normal and breath sounds normal.  - no rales or wheezing Neurological: Pt is alert.  Skin: Skin is warm. No erythema.  Psychiatric: Pt behavior is normal. Thought content normal.     Assessment & Plan:

## 2012-04-15 ENCOUNTER — Emergency Department (HOSPITAL_COMMUNITY)
Admission: EM | Admit: 2012-04-15 | Discharge: 2012-04-15 | Disposition: A | Discharge status: Home / Self Care | Payer: 59 | Source: Home / Self Care | Attending: Emergency Medicine | Admitting: Emergency Medicine

## 2012-04-15 ENCOUNTER — Encounter (HOSPITAL_COMMUNITY): Payer: Self-pay | Admitting: Emergency Medicine

## 2012-04-15 DIAGNOSIS — J040 Acute laryngitis: Secondary | ICD-10-CM

## 2012-04-15 MED ORDER — PREDNISONE 5 MG PO KIT: 1.0000 | PACK | Freq: Every day | ORAL | Status: DC

## 2012-04-15 MED ORDER — BENZONATATE 200 MG PO CAPS: 200.0000 mg | ORAL_CAPSULE | Freq: Three times a day (TID) | ORAL | Status: DC | PRN

## 2012-04-15 NOTE — ED Provider Notes (Signed)
Chief Complaint  Patient presents with  . Sore Throat    History of Present Illness:   Kathy Mccoy is a 31 year old female with a two-day history of sore throat, hoarseness, nasal congestion, rhinorrhea, postnasal drainage, dry cough, and tightness in her chest. She denies any fever, chills, headache, wheezing, or GI symptoms.  Review of Systems:  Other than noted above, the patient denies any of the following symptoms. Systemic:  No fever, chills, sweats, fatigue, myalgias, headache, or anorexia. Eye:  No redness, pain or drainage. ENT:  No earache, ear congestion, nasal congestion, sneezing, rhinorrhea, sinus pressure, sinus pain, post nasal drip, or sore throat. Lungs:  No cough, sputum production, wheezing, shortness of breath, or chest pain. GI:  No abdominal pain, nausea, vomiting, or diarrhea.  PMFSH:  Past medical history, family history, social history, meds, and allergies were reviewed.  Physical Exam:   Vital signs:  BP 118/70  Pulse 74  Temp 97.8 F (36.6 C) (Oral)  Resp 16  SpO2 100% General:  Alert, in no distress. Eye:  No conjunctival injection or drainage. Lids were normal. ENT:  TMs and canals were normal, without erythema or inflammation.  Nasal mucosa was clear and uncongested, without drainage.  Mucous membranes were moist.  Pharynx was clear, without exudate or drainage.  There were no oral ulcerations or lesions. Neck:  Supple, no adenopathy, tenderness or mass. Lungs:  No respiratory distress.  Lungs were clear to auscultation, without wheezes, rales or rhonchi.  Breath sounds were clear and equal bilaterally.  Heart:  Regular rhythm, without gallops, murmers or rubs. Skin:  Clear, warm, and dry, without rash or lesions.  Labs:   Results for orders placed during the hospital encounter of 04/15/12  POCT RAPID STREP A ([facility] ONLY)      Component Value Range   Streptococcus, Group A Screen (Direct) NEGATIVE  NEGATIVE   Assessment:  The encounter diagnosis  was Laryngitis.  Plan:   1.  The following meds were prescribed:   New Prescriptions   BENZONATATE (TESSALON) 200 MG CAPSULE    Take 1 capsule (200 mg total) by mouth 3 (three) times daily as needed for cough.   PREDNISONE 5 MG KIT    Take 1 kit (5 mg total) by mouth daily after breakfast. Prednisone 5 mg 6 day dosepack.  Take as directed.   2.  The patient was instructed in symptomatic care and handouts were given. 3.  The patient was told to return if becoming worse in any way, if no better in 3 or 4 days, and given some red flag symptoms that would indicate earlier return.   Reuben Likes, MD 04/15/12 2005

## 2012-04-15 NOTE — ED Notes (Signed)
Reports sore throat.  States this morning patient had a voice then later on it was gone.  Patient says she did have a sinus infection a couple of days ago but they are gone.  Admits to have drainage going down throat and ears feels like pressure

## 2012-04-29 ENCOUNTER — Telehealth: Payer: Self-pay | Admitting: Internal Medicine

## 2012-04-29 MED ORDER — SUMATRIPTAN SUCCINATE 100 MG PO TABS: ORAL_TABLET | ORAL | Status: DC

## 2012-04-29 NOTE — Telephone Encounter (Signed)
Patient Information:  Caller Name: Saumya  Phone: [phone]  Patient: Kathy Mccoy, Kathy Mccoy  Gender: Female  DOB: [birth year]  Age: 32 Years  PCP: Oliver Barre (Adults only)  Pregnant: No  Office Follow Up:  Does the office need to follow up with this patient?: Yes  Instructions For The Office: OFFICE PLEASE REVIEW FOR POSSBILE REFILL ON GENERIC IMITREX 100mg    THAT PT HAS HAD IN THE PAST (started August 2012).  Pt was last seen in the office 07/2011  RN Note:  OFFICE PLEASE REVIEW: Pt would like to have a refill on her generic Imatrex that she has had by history; pt would like to have a refill to have this on hand for other migranes;  Symptoms  Reason For Call & Symptoms: Hx: Migranes; no meds daily; Avelox allergy: no recent surgeries  Pt is calling and statets that she she has a migrane that started 04/29/12 at 6:30am;  vomiting with this migranes; rates 1-2/10  Reviewed Health History In EMR: Yes  Reviewed Medications In EMR: Yes  Reviewed Allergies In EMR: Yes  Reviewed Surgeries / Procedures: Yes  Date of Onset of Symptoms: 04/29/2012  Treatments Tried: hot bathes; Motrin 800mg   Treatments Tried Worked: No OB / GYN:  LMP: Unknown  Guideline(s) Used:  Headache  Disposition Per Guideline:   Home Care  Reason For Disposition Reached:   Mild-moderate headache  Advice Given:  Pain Medicines:  For pain relief, you can take either acetaminophen, ibuprofen, or naproxen.  They are over-the-counter (OTC) pain drugs. You can buy them at the drugstore.  Rest:   Lie down in a dark, quiet place and try to relax. Close your eyes and imagine your entire body relaxing.  Apply Cold to the Area:   Apply a cold wet washcloth or cold pack to the forehead for 20 minutes.  Call Back If:  Headache lasts longer than 24 hours  You become worse.

## 2012-04-29 NOTE — Telephone Encounter (Signed)
Done erx 

## 2012-09-09 ENCOUNTER — Encounter: Payer: Self-pay | Admitting: Internal Medicine

## 2012-09-09 ENCOUNTER — Ambulatory Visit (INDEPENDENT_AMBULATORY_CARE_PROVIDER_SITE_OTHER): Payer: 59 | Admitting: Internal Medicine

## 2012-09-09 VITALS — BP 120/80 | HR 100 | Temp 97.9°F | Ht 67.0 in | Wt 121.1 lb

## 2012-09-09 DIAGNOSIS — J019 Acute sinusitis, unspecified: Secondary | ICD-10-CM

## 2012-09-09 MED ORDER — LEVOFLOXACIN 250 MG PO TABS: 250.0000 mg | ORAL_TABLET | Freq: Every day | ORAL | Status: DC

## 2012-09-09 MED ORDER — AZITHROMYCIN 250 MG PO TABS: ORAL_TABLET | ORAL | Status: DC

## 2012-09-09 NOTE — Progress Notes (Signed)
  Subjective:    Patient ID: Kathy Mccoy, female    DOB: 1980/08/23, 32 y.o.   MRN: [id number]  HPI  Here with 2-3 days acute onset fever, facial pain, pressure, headache, general weakness and malaise, and greenish d/c, with mild ST and cough, but pt denies chest pain, wheezing, increased sob or doe, orthopnea, PND, increased LE swelling, palpitations, dizziness or syncope. Not pregnant Past Medical History  Diagnosis Date  . Acute pharyngitis 03/23/2010  . ANXIETY DEPRESSION 01/02/2009  . CARDIAC ARRHYTHMIA 01/12/2009  . COMMON MIGRAINE 01/02/2009  . PREMATURE VENTRICULAR CONTRACTIONS 02/21/2009  . SINUSITIS- ACUTE-NOS 08/15/2009  . SYNCOPE 01/02/2009   Past Surgical History  Procedure Laterality Date  . S/p rhinoplasty  2009    reports that she has quit smoking. She does not have any smokeless tobacco history on file. She reports that  drinks alcohol. Her drug history is not on file. family history is not on file. Allergies  Allergen Reactions  . Moxifloxacin Rash  . Sertraline Hcl Anxiety   Current Outpatient Prescriptions on File Prior to Visit  Medication Sig Dispense Refill  . SUMAtriptan (IMITREX) 100 MG tablet TAKE 1 TABLET BY MOUTH EVERY OTHER DAY AS NEEDED FOR MIGRAINE  9 tablet  2   No current facility-administered medications on file prior to visit.   Review of Systems All otherwise neg per pt     Objective:   Physical Exam BP 120/80  Pulse 100  Temp(Src) 97.9 F (36.6 C) (Oral)  Ht 5\' 7"  (1.702 m)  Wt 121 lb 2 oz (54.942 kg)  BMI 18.97 kg/m2  SpO2 98% VS noted, mild ill Constitutional: Pt appears well-developed and well-nourished.  HENT: Head: NCAT.  Right Ear: External ear normal.  Left Ear: External ear normal.  Eyes: Conjunctivae and EOM are normal. Pupils are equal, round, and reactive to light.  Bilat tm's with mild erythema.  Max sinus areas mild tender.  Pharynx with mild erythema, no exudate Neck: Normal range of motion. Neck supple.  Cardiovascular:  Normal rate and regular rhythm.   Pulmonary/Chest: Effort normal and breath sounds normal.  Neurological: Pt is alert. Not confused  Skin: Skin is warm. No erythema.  Psychiatric: Pt behavior is normal. Thought content normal.     Assessment & Plan:

## 2012-09-09 NOTE — Patient Instructions (Signed)
Please take all new medication as prescribed Please continue all other medications as before You are given the work note You can also take Mjucinex OTC, and/or sudafed OTC as needed for congestion

## 2012-09-09 NOTE — Assessment & Plan Note (Signed)
Mild to mod, for antibx course,  to f/u any worsening symptoms or concerns 

## 2013-11-15 ENCOUNTER — Other Ambulatory Visit: Payer: Self-pay | Admitting: Obstetrics and Gynecology

## 2013-11-15 DIAGNOSIS — N63 Unspecified lump in unspecified breast: Secondary | ICD-10-CM

## 2013-11-16 LAB — CYTOLOGY - PAP

## 2013-11-21 ENCOUNTER — Encounter (INDEPENDENT_AMBULATORY_CARE_PROVIDER_SITE_OTHER): Payer: Self-pay

## 2013-11-21 ENCOUNTER — Ambulatory Visit
Admission: RE | Admit: 2013-11-21 | Discharge: 2013-11-21 | Disposition: A | Discharge status: Home / Self Care | Payer: 59 | Source: Ambulatory Visit | Attending: Obstetrics and Gynecology | Admitting: Obstetrics and Gynecology

## 2013-11-21 DIAGNOSIS — N63 Unspecified lump in unspecified breast: Secondary | ICD-10-CM

## 2013-12-30 ENCOUNTER — Ambulatory Visit (INDEPENDENT_AMBULATORY_CARE_PROVIDER_SITE_OTHER): Payer: 59 | Admitting: Internal Medicine

## 2013-12-30 ENCOUNTER — Encounter: Payer: Self-pay | Admitting: Internal Medicine

## 2013-12-30 VITALS — BP 104/62 | HR 86 | Temp 98.3°F | Resp 20 | Ht 67.0 in | Wt 122.1 lb

## 2013-12-30 DIAGNOSIS — J019 Acute sinusitis, unspecified: Secondary | ICD-10-CM

## 2013-12-30 MED ORDER — SUMATRIPTAN SUCCINATE 100 MG PO TABS: ORAL_TABLET | ORAL | Status: DC

## 2013-12-30 NOTE — Progress Notes (Signed)
Pre visit review using our clinic review tool, if applicable. No additional management support is needed unless otherwise documented below in the visit note. 

## 2013-12-30 NOTE — Assessment & Plan Note (Signed)
Mild and encouraged her to continue taking allergra OTC and flonase OTC. She was advised that she can use afrin for up to 3 days to help with symptoms. No antibiotic needed at this time. She will call back with fevers, SOB, or continued symptoms in 1 week.

## 2013-12-30 NOTE — Progress Notes (Signed)
   Subjective:    Patient ID: Kathy Mccoy, female    DOB: October 24, 1980, 33 y.o.   MRN: [id number]  HPI The patient is a 33 YO female coming in with sinus troubles. She has been having drainage from her nose the last 2-3 days. She is not having headaches, fevers, chills, cough, SOB. She has been having mild drainage down her throat which is improving. She is having clear discharge from her nose. She is having some mild tenderness in her ears but no drainage. She denies nausea, vomiting. No other complaints. She has twin 36 YO sons who are having some congestion as well at this time.   Review of Systems  Constitutional: Negative for fever, chills, activity change, appetite change and fatigue.  HENT: Positive for congestion, rhinorrhea, sinus pressure and sneezing. Negative for dental problem, drooling, ear discharge, ear pain, facial swelling, hearing loss, mouth sores, nosebleeds, postnasal drip, sore throat, tinnitus, trouble swallowing and voice change.   Eyes: Positive for discharge and itching. Negative for photophobia, pain, redness and visual disturbance.  Respiratory: Negative for cough, chest tightness, shortness of breath and wheezing.   Cardiovascular: Negative for chest pain, palpitations and leg swelling.  Gastrointestinal: Negative for nausea, abdominal pain, diarrhea and abdominal distention.  Allergic/Immunologic: Positive for environmental allergies.  Neurological: Negative for dizziness, weakness and light-headedness.      Objective:   Physical Exam  Vitals reviewed. Constitutional: She appears well-developed and well-nourished. No distress.  HENT:  Head: Normocephalic and atraumatic.  Right Ear: External ear normal.  Left Ear: External ear normal.  Oropharynx with mild erythema, no purulent discharge or tonsillar edema. Nose with mild crusting in the left nostril and some swelling and redness of the turbinates bilaterally. No sinus tenderness on exam.  Eyes: EOM are normal.    Mild tearing but no redness or crusting.  Neck: Normal range of motion. No JVD present. No thyromegaly present.  Cardiovascular: Normal rate and regular rhythm.   No murmur heard. Pulmonary/Chest: Effort normal and breath sounds normal. No respiratory distress. She has no wheezes. She has no rales.  Abdominal: Soft. Bowel sounds are normal.  Lymphadenopathy:    She has no cervical adenopathy.      Assessment & Plan:

## 2013-12-30 NOTE — Patient Instructions (Signed)
Continue taking claritin (you can take 2 pills a day for 5 days to help with the congestion more) and keep taking the flonase 2 sprays twice a day for the next 1-2 weeks.   You can also use afrin for the next 3-4 days but do not use it longer as it can make you more congested when you stop.   Call us back if you start having more cough, breathing problems, or still have problems in about 5 days.  Allergic Rhinitis Allergic rhinitis is when the mucous membranes in the nose respond to allergens. Allergens are particles in the air that cause your body to have an allergic reaction. This causes you to release allergic antibodies. Through a chain of events, these eventually cause you to release histamine into the blood stream. Although meant to protect the body, it is this release of histamine that causes your discomfort, such as frequent sneezing, congestion, and an itchy, runny nose.  CAUSES  Seasonal allergic rhinitis (hay fever) is caused by pollen allergens that may come from grasses, trees, and weeds. Year-round allergic rhinitis (perennial allergic rhinitis) is caused by allergens such as house dust mites, pet dander, and mold spores.  SYMPTOMS   Nasal stuffiness (congestion).  Itchy, runny nose with sneezing and tearing of the eyes. DIAGNOSIS  Your health care provider can help you determine the allergen or allergens that trigger your symptoms. If you and your health care provider are unable to determine the allergen, skin or blood testing may be used. TREATMENT  Allergic rhinitis does not have a cure, but it can be controlled by:  Medicines and allergy shots (immunotherapy).  Avoiding the allergen. Hay fever may often be treated with antihistamines in pill or nasal spray forms. Antihistamines block the effects of histamine. There are over-the-counter medicines that may help with nasal congestion and swelling around the eyes. Check with your health care provider before taking or giving this  medicine.  If avoiding the allergen or the medicine prescribed do not work, there are many new medicines your health care provider can prescribe. Stronger medicine may be used if initial measures are ineffective. Desensitizing injections can be used if medicine and avoidance does not work. Desensitization is when a patient is given ongoing shots until the body becomes less sensitive to the allergen. Make sure you follow up with your health care provider if problems continue. HOME CARE INSTRUCTIONS It is not possible to completely avoid allergens, but you can reduce your symptoms by taking steps to limit your exposure to them. It helps to know exactly what you are allergic to so that you can avoid your specific triggers. SEEK MEDICAL CARE IF:   You have a fever.  You develop a cough that does not stop easily (persistent).  You have shortness of breath.  You start wheezing.  Symptoms interfere with normal daily activities. Document Released: 12/31/2000 Document Revised: 04/12/2013 Document Reviewed: 12/13/2012 [facility] Patient Information 2015 [place], [place]. This information is not intended to replace advice given to you by your health care provider. Make sure you discuss any questions you have with your health care provider.

## 2014-01-03 ENCOUNTER — Encounter: Payer: Self-pay | Admitting: Internal Medicine

## 2014-01-03 ENCOUNTER — Ambulatory Visit (INDEPENDENT_AMBULATORY_CARE_PROVIDER_SITE_OTHER): Payer: 59 | Admitting: Internal Medicine

## 2014-01-03 VITALS — BP 98/68 | HR 74 | Temp 98.3°F | Wt 120.5 lb

## 2014-01-03 DIAGNOSIS — J209 Acute bronchitis, unspecified: Secondary | ICD-10-CM

## 2014-01-03 MED ORDER — HYDROCOD POLST-CHLORPHEN POLST 10-8 MG/5ML PO LQCR: 5.0000 mL | Freq: Two times a day (BID) | ORAL | Status: DC | PRN

## 2014-01-03 MED ORDER — AZITHROMYCIN 250 MG PO TABS: ORAL_TABLET | ORAL | Status: DC

## 2014-01-03 MED ORDER — HYDROCODONE-HOMATROPINE 5-1.5 MG/5ML PO SYRP: 5.0000 mL | ORAL_SOLUTION | Freq: Four times a day (QID) | ORAL | Status: DC | PRN

## 2014-01-03 NOTE — Patient Instructions (Signed)
Please take all new medication as prescribed  Please continue all other medications as before, and refills have been done if requested.  Please have the pharmacy call with any other refills you may need.  Please keep your appointments with your specialists as you may have planned     

## 2014-01-03 NOTE — Progress Notes (Signed)
Pre visit review using our clinic review tool, if applicable. No additional management support is needed unless otherwise documented below in the visit note. 

## 2014-01-03 NOTE — Progress Notes (Signed)
   Subjective:    Patient ID: Kathy Mccoy, female    DOB: [birth year], 33 y.o.   MRN: [id number]  HPI  Here with acute onset mild to mod 2-3 days ST, HA, general weakness and malaise, with prod cough greenish sputum, but Pt denies chest pain, increased sob or doe, wheezing, orthopnea, PND, increased LE swelling, palpitations, dizziness or syncope. Past Medical History  Diagnosis Date  . Acute pharyngitis 03/23/2010  . ANXIETY DEPRESSION 01/02/2009  . CARDIAC ARRHYTHMIA 01/12/2009  . COMMON MIGRAINE 01/02/2009  . PREMATURE VENTRICULAR CONTRACTIONS 02/21/2009  . SINUSITIS- ACUTE-NOS 08/15/2009  . SYNCOPE 01/02/2009   Past Surgical History  Procedure Laterality Date  . S/p rhinoplasty  2009    reports that she has quit smoking. She does not have any smokeless tobacco history on file. She reports that she drinks alcohol. Her drug history is not on file. family history is not on file. Allergies  Allergen Reactions  . Moxifloxacin Rash  . Sertraline Hcl Anxiety   Current Outpatient Prescriptions on File Prior to Visit  Medication Sig Dispense Refill  . SUMAtriptan (IMITREX) 100 MG tablet TAKE 1 TABLET BY MOUTH EVERY OTHER DAY AS NEEDED FOR MIGRAINE  9 tablet  2   No current facility-administered medications on file prior to visit.    Review of Systems All otherwise neg per pt     Objective:   Physical Exam BP 98/68  Pulse 74  Temp(Src) 98.3 F (36.8 C) (Oral)  Wt 120 lb 8 oz (54.658 kg)  SpO2 97% VS noted, mild ill Constitutional: Pt appears well-developed, well-nourished.  HENT: Head: NCAT.  Right Ear: External ear normal.  Left Ear: External ear normal.  Eyes: . Pupils are equal, round, and reactive to light. Conjunctivae and EOM are normal Bilat tm's with mild erythema.  Max sinus areas non tender.  Pharynx with mild erythema, no exudate Neck: Normal range of motion. Neck supple.  Cardiovascular: Normal rate and regular rhythm.   Pulmonary/Chest: Effort normal and breath  sounds normal.  Neurological: Pt is alert. Not confused , motor grossly intact Skin: Skin is warm. No rash Psychiatric: Pt behavior is normal. No agitation.     Assessment & Plan:   BP Readings from Last 3 Encounters:  01/03/14 98/68  12/30/13 104/62  09/09/12 120/80

## 2014-01-08 NOTE — Assessment & Plan Note (Signed)
Mild to mod, for antibx course,  to f/u any worsening symptoms or concerns 

## 2014-01-10 ENCOUNTER — Telehealth: Payer: Self-pay | Admitting: Internal Medicine

## 2014-01-10 NOTE — Telephone Encounter (Signed)
New question:     Per Victorino Dike  At [place] [facility] they need  Pre Med for pt's dental appointment.   The office would like to know pt's DX please give Victorino Dike a call back.

## 2014-01-10 NOTE — Telephone Encounter (Signed)
Informed dental office that we have not seen this patient since 2010, only seen twice, and cannot advise them on pre-med. Kathy Mccoy verbalized understanding.

## 2014-06-02 ENCOUNTER — Telehealth: Payer: Self-pay

## 2014-06-02 NOTE — Telephone Encounter (Signed)
Pt declined getting the flu vaccine this season.

## 2014-08-25 ENCOUNTER — Ambulatory Visit (INDEPENDENT_AMBULATORY_CARE_PROVIDER_SITE_OTHER): Payer: 59 | Admitting: Internal Medicine

## 2014-08-25 ENCOUNTER — Telehealth: Payer: Self-pay | Admitting: Internal Medicine

## 2014-08-25 ENCOUNTER — Encounter: Payer: Self-pay | Admitting: Internal Medicine

## 2014-08-25 VITALS — BP 110/68 | HR 78 | Temp 97.8°F | Resp 18 | Ht 67.0 in | Wt 119.0 lb

## 2014-08-25 DIAGNOSIS — F341 Dysthymic disorder: Secondary | ICD-10-CM

## 2014-08-25 DIAGNOSIS — H00016 Hordeolum externum left eye, unspecified eyelid: Secondary | ICD-10-CM

## 2014-08-25 DIAGNOSIS — G43009 Migraine without aura, not intractable, without status migrainosus: Secondary | ICD-10-CM | POA: Diagnosis not present

## 2014-08-25 DIAGNOSIS — H00019 Hordeolum externum unspecified eye, unspecified eyelid: Secondary | ICD-10-CM | POA: Insufficient documentation

## 2014-08-25 DIAGNOSIS — J019 Acute sinusitis, unspecified: Secondary | ICD-10-CM | POA: Diagnosis not present

## 2014-08-25 MED ORDER — CEPHALEXIN 500 MG PO CAPS: 500.0000 mg | ORAL_CAPSULE | Freq: Four times a day (QID) | ORAL | Status: DC

## 2014-08-25 MED ORDER — SUMATRIPTAN SUCCINATE 100 MG PO TABS: ORAL_TABLET | ORAL | Status: DC

## 2014-08-25 MED ORDER — LEVOFLOXACIN 500 MG PO TABS: 500.0000 mg | ORAL_TABLET | Freq: Every day | ORAL | Status: DC

## 2014-08-25 NOTE — Progress Notes (Signed)
   Subjective:    Patient ID: Kathy Mccoy, female    DOB: 03/30/1981, 34 y.o.   MRN: [id number]  HPI   Here with 2-3 days acute onset fever, facial pain, pressure, headache, general weakness and malaise, and greenish d/c, with mild ST and cough, but pt denies chest pain, wheezing, increased sob or doe, orthopnea, PND, increased LE swelling, palpitations, dizziness or syncope. Denies worsening depressive symptoms, suicidal ideation, or panic; has ongoing anxiety, not increased recently, despite being a single mom of 2 boys.  Has recurring typical migraine twice per month in last 6 mo, asks for imitrex refill Past Medical History  Diagnosis Date  . Acute pharyngitis 03/23/2010  . ANXIETY DEPRESSION 01/02/2009  . CARDIAC ARRHYTHMIA 01/12/2009  . COMMON MIGRAINE 01/02/2009  . PREMATURE VENTRICULAR CONTRACTIONS 02/21/2009  . SINUSITIS- ACUTE-NOS 08/15/2009  . SYNCOPE 01/02/2009   Past Surgical History  Procedure Laterality Date  . S/p rhinoplasty  2009    reports that she has quit smoking. She does not have any smokeless tobacco history on file. She reports that she drinks alcohol. Her drug history is not on file. family history is not on file. Allergies  Allergen Reactions  . Moxifloxacin Rash  . Sertraline Hcl Anxiety   No current outpatient prescriptions on file prior to visit.   No current facility-administered medications on file prior to visit.   Review of Systems  Constitutional: Negative for unusual diaphoresis or night sweats HENT: Negative for ringing in ear or discharge Eyes: Negative for double vision or worsening visual disturbance.  Respiratory: Negative for choking and stridor.   Gastrointestinal: Negative for vomiting or other signifcant bowel change Genitourinary: Negative for hematuria or change in urine volume.  Musculoskeletal: Negative for other MSK pain or swelling Skin: Negative for color change and worsening wound.  Neurological: Negative for tremors and numbness  other than noted  Psychiatric/Behavioral: Negative for decreased concentration or agitation other than above   Does have 3 mo ongoing stye to left medial lower lid edge - white spot, nontender    Objective:   Physical Exam BP 110/68 mmHg  Pulse 78  Temp(Src) 97.8 F (36.6 C) (Oral)  Resp 18  Ht 5\' 7"  (1.702 m)  Wt 119 lb (53.978 kg)  BMI 18.63 kg/m2  SpO2 98% VS noted, mild ill Constitutional: Pt appears in no significant distress HENT: Head: NCAT.  Right Ear: External ear normal.  Left Ear: External ear normal.  Bilat tm's with mild erythema.  Max sinus areas mild tender.  Pharynx with mild erythema, no exudate Eyes: . Pupils are equal, round, and reactive to light. Conjunctivae and EOM are normal, left lower medial stye nontender but slight nondiscrete swelling surrounding involving the lower lid Neck: Normal range of motion. Neck supple.  Cardiovascular: Normal rate and regular rhythm.   Pulmonary/Chest: Effort normal and breath sounds without rales or wheezing. Neurological: Pt is alert. Not confused , motor grossly intact Skin: Skin is warm. No rash, no LE edema Psychiatric: Pt behavior is normal. No agitation. not depressed affect    Assessment & Plan:

## 2014-08-25 NOTE — Patient Instructions (Signed)
Please take all new medication as prescribed - the antibiotic  Please continue all other medications as before, and refills have been done if requested. - the imitrex  Please have the pharmacy call with any other refills you may need.  Please keep your appointments with your specialists as you may have planned  Please see your opthomologist for the stye if not improving

## 2014-08-25 NOTE — Telephone Encounter (Signed)
Please fax Dr.'s note to Fax#[phone]. Thank you

## 2014-08-25 NOTE — Assessment & Plan Note (Signed)
Mild persistent without infection or hordoleum, for hot compresses, consider optho f/u if persists

## 2014-08-25 NOTE — Assessment & Plan Note (Signed)
Mild to mod, for antibx course,  to f/u any worsening symptoms or concerns 

## 2014-08-25 NOTE — Telephone Encounter (Signed)
Faxed letter

## 2014-08-25 NOTE — Assessment & Plan Note (Signed)
Overall stable, for imitrex refill,  to f/u any worsening symptoms or concerns

## 2014-08-25 NOTE — Assessment & Plan Note (Signed)
stable overall by history and exam, recent data reviewed with pt, and pt to continue medical treatment as before,  to f/u any worsening symptoms or concerns Lab Results  Component Value Date   WBC 5.5 01/02/2009   HGB 14.6 01/02/2009   HCT 43.1 01/02/2009   PLT 202.0 * K/uL 01/02/2009   GLUCOSE 105* 01/02/2009   ALT 14 01/02/2009   AST 23 01/02/2009   NA 142 01/02/2009   K 4.1 01/02/2009   CL 107 01/02/2009   CREATININE 0.8 01/02/2009   BUN 9 01/02/2009   CO2 30 01/02/2009   TSH 1.06 01/02/2009

## 2014-12-02 ENCOUNTER — Encounter: Payer: Self-pay | Admitting: Family Medicine

## 2014-12-02 ENCOUNTER — Ambulatory Visit (INDEPENDENT_AMBULATORY_CARE_PROVIDER_SITE_OTHER): Payer: 59 | Admitting: Family Medicine

## 2014-12-02 VITALS — BP 110/70 | Temp 98.3°F | Wt 116.0 lb

## 2014-12-02 DIAGNOSIS — J019 Acute sinusitis, unspecified: Secondary | ICD-10-CM

## 2014-12-02 MED ORDER — AZITHROMYCIN 250 MG PO TABS: ORAL_TABLET | ORAL | Status: DC

## 2014-12-02 NOTE — Addendum Note (Signed)
Addended by: Dianne Dun on: 12/02/2014 09:13 AM   Modules accepted: Kipp Brood

## 2014-12-02 NOTE — Progress Notes (Addendum)
Pre visit review using our clinic review tool, if applicable. No additional management support is needed unless otherwise documented below in the visit note.  SUBJECTIVE:  Kathy Mccoy is a 34 y.o. female pt of Dr. Jonny Ruiz, new to me, who presents to weekend clinic with complaints of coryza, congestion, sneezing, sore throat, bilateral sinus pain and chills for 8 days. She denies a history of dizziness, fatigue and shortness of breath and denies a history of asthma. Patient has smoke cigarettes.   Current Outpatient Prescriptions on File Prior to Visit  Medication Sig Dispense Refill  . cephALEXin (KEFLEX) 500 MG capsule Take 1 capsule (500 mg total) by mouth 4 (four) times daily. 40 capsule 0  . SUMAtriptan (IMITREX) 100 MG tablet TAKE 1 TABLET BY MOUTH EVERY OTHER DAY AS NEEDED FOR MIGRAINE 9 tablet 5   No current facility-administered medications on file prior to visit.    Allergies  Allergen Reactions  . Moxifloxacin Rash  . Sertraline Hcl Anxiety    Past Medical History  Diagnosis Date  . Acute pharyngitis 03/23/2010  . ANXIETY DEPRESSION 01/02/2009  . CARDIAC ARRHYTHMIA 01/12/2009  . COMMON MIGRAINE 01/02/2009  . PREMATURE VENTRICULAR CONTRACTIONS 02/21/2009  . SINUSITIS- ACUTE-NOS 08/15/2009  . SYNCOPE 01/02/2009    Past Surgical History  Procedure Laterality Date  . S/p rhinoplasty  2009    No family history on file.  Social History   Social History  . Marital Status: Single    Spouse Name: N/A  . Number of Children: 2  . Years of Education: N/A   Occupational History  . brokerage admin support    Social History Main Topics  . Smoking status: Former Games developer  . Smokeless tobacco: Not on file  . Alcohol Use: Yes     Comment: rare  . Drug Use: Not on file  . Sexual Activity: Not on file   Other Topics Concern  . Not on file   Social History Narrative   The PMH, PSH, Social History, Family History, Medications, and allergies have been reviewed in [facility], and have  been updated if relevant.  OBJECTIVE: BP 110/70 mmHg  Temp(Src) 98.3 F (36.8 C) (Oral)  Wt 116 lb (52.617 kg)  She appears well, vital signs are as noted. Ears normal.  Throat and pharynx normal.  Neck supple. No adenopathy in the neck. Nose is congested. Sinuses tender. The chest is clear, without wheezes or rales.  ASSESSMENT:  sinusitis  PLAN: Given duration and progression of symptoms, will treat for bacterial sinusitis with zpack.  Symptomatic therapy suggested: push fluids, rest and return office visit prn if symptoms persist or worsen.. Call or return to clinic prn if these symptoms worsen or fail to improve as anticipated.

## 2014-12-02 NOTE — Patient Instructions (Signed)
Take zpack as directed.  Drink lots of fluids.    Treat sympotmatically with Mucinex, nasal saline irrigation, and Tylenol/Ibuprofen.   Try over the counter nasocort-start with 2 sprays per nostril per day...and then try to taper to 1 spray per nostril once symptoms improve.   You can use warm compresses.    Call if not improving as expected in 5-7 days.

## 2015-04-24 ENCOUNTER — Ambulatory Visit (INDEPENDENT_AMBULATORY_CARE_PROVIDER_SITE_OTHER): Payer: 59 | Admitting: Internal Medicine

## 2015-04-24 ENCOUNTER — Encounter: Payer: Self-pay | Admitting: Internal Medicine

## 2015-04-24 ENCOUNTER — Other Ambulatory Visit (INDEPENDENT_AMBULATORY_CARE_PROVIDER_SITE_OTHER): Payer: 59

## 2015-04-24 VITALS — BP 104/60 | HR 87 | Temp 97.7°F | Ht 67.0 in | Wt 121.0 lb

## 2015-04-24 DIAGNOSIS — Z0000 Encounter for general adult medical examination without abnormal findings: Secondary | ICD-10-CM | POA: Diagnosis not present

## 2015-04-24 DIAGNOSIS — R55 Syncope and collapse: Secondary | ICD-10-CM

## 2015-04-24 DIAGNOSIS — G43009 Migraine without aura, not intractable, without status migrainosus: Secondary | ICD-10-CM

## 2015-04-24 DIAGNOSIS — F341 Dysthymic disorder: Secondary | ICD-10-CM

## 2015-04-24 HISTORY — DX: Syncope and collapse: R55

## 2015-04-24 LAB — URINALYSIS, ROUTINE W REFLEX MICROSCOPIC
BILIRUBIN URINE: NEGATIVE
HGB URINE DIPSTICK: NEGATIVE
KETONES UR: NEGATIVE
LEUKOCYTES UA: NEGATIVE
NITRITE: NEGATIVE
RBC / HPF: NONE SEEN (ref 0–?)
Specific Gravity, Urine: 1.015 (ref 1.000–1.030)
TOTAL PROTEIN, URINE-UPE24: NEGATIVE
Urine Glucose: NEGATIVE
Urobilinogen, UA: 0.2 (ref 0.0–1.0)
pH: 7.5 (ref 5.0–8.0)

## 2015-04-24 LAB — CBC WITH DIFFERENTIAL/PLATELET
Basophils Absolute: 0 10*3/uL (ref 0.0–0.1)
Basophils Relative: 0.3 % (ref 0.0–3.0)
Eosinophils Absolute: 0 10*3/uL (ref 0.0–0.7)
Eosinophils Relative: 0.5 % (ref 0.0–5.0)
HEMATOCRIT: 40.5 % (ref 36.0–46.0)
Hemoglobin: 13.4 g/dL (ref 12.0–15.0)
LYMPHS ABS: 2 10*3/uL (ref 0.7–4.0)
LYMPHS PCT: 32.8 % (ref 12.0–46.0)
MCHC: 33 g/dL (ref 30.0–36.0)
MCV: 97.4 fl (ref 78.0–100.0)
MONOS PCT: 8.2 % (ref 3.0–12.0)
Monocytes Absolute: 0.5 10*3/uL (ref 0.1–1.0)
NEUTROS PCT: 58.2 % (ref 43.0–77.0)
Neutro Abs: 3.6 10*3/uL (ref 1.4–7.7)
Platelets: 162 10*3/uL (ref 150.0–400.0)
RBC: 4.16 Mil/uL (ref 3.87–5.11)
RDW: 13.1 % (ref 11.5–15.5)
WBC: 6.2 10*3/uL (ref 4.0–10.5)

## 2015-04-24 LAB — BASIC METABOLIC PANEL
BUN: 10 mg/dL (ref 6–23)
CO2: 28 meq/L (ref 19–32)
CREATININE: 0.7 mg/dL (ref 0.40–1.20)
Calcium: 9.2 mg/dL (ref 8.4–10.5)
Chloride: 106 mEq/L (ref 96–112)
GFR: 102.43 mL/min (ref >60.00)
Glucose, Bld: 95 mg/dL (ref 70–99)
Potassium: 3.7 mEq/L (ref 3.5–5.1)
Sodium: 138 mEq/L (ref 135–145)

## 2015-04-24 LAB — LIPID PANEL
CHOL/HDL RATIO: 2
Cholesterol: 147 mg/dL (ref 0–200)
HDL: 59.5 mg/dL (ref >39.00)
LDL Cholesterol: 76 mg/dL (ref 0–99)
NonHDL: 87.4
TRIGLYCERIDES: 56 mg/dL (ref 0.0–149.0)
VLDL: 11.2 mg/dL (ref 0.0–40.0)

## 2015-04-24 LAB — HEPATIC FUNCTION PANEL
ALBUMIN: 4.5 g/dL (ref 3.5–5.2)
ALT: 15 U/L (ref 0–35)
AST: 16 U/L (ref 0–37)
Alkaline Phosphatase: 38 U/L — ABNORMAL LOW (ref 39–117)
BILIRUBIN TOTAL: 0.7 mg/dL (ref 0.2–1.2)
Bilirubin, Direct: 0.1 mg/dL (ref 0.0–0.3)
Total Protein: 6.7 g/dL (ref 6.0–8.3)

## 2015-04-24 LAB — TSH: TSH: 2.55 u[IU]/mL (ref 0.35–4.50)

## 2015-04-24 NOTE — Assessment & Plan Note (Signed)

## 2015-04-24 NOTE — Patient Instructions (Signed)
Your EKG was OK today  Please continue all other medications as before, and refills have been done if requested.  Please have the pharmacy call with any other refills you may need.  Please continue your efforts at being more active, low cholesterol diet, and weight control.  You are otherwise up to date with prevention measures today.  Please keep your appointments with your specialists as you may have planned  You will be contacted regarding the referral for: Head MRI, Neurology, and [facility]  Please go to the LAB in the Basement (turn left off the elevator) for the tests to be done today  You will be contacted by phone if any changes need to be made immediately.  Otherwise, you will receive a letter about your results with an explanation, but please check with MyChart first.  Please remember to sign up for MyChart if you have not done so, as this will be important to you in the future with finding out test results, communicating by private email, and scheduling acute appointments online when needed.  Please return in 6 months, or sooner if needed

## 2015-04-24 NOTE — Progress Notes (Signed)
Subjective:    Patient ID: Kathy Mccoy, female    DOB: 03/16/1981, 35 y.o.   MRN: [id number]  HPI  Here for wellness and f/u;  Overall doing ok;  Pt denies Chest pain, worsening SOB, DOE, wheezing, orthopnea, PND, worsening LE edema, palpitations, or syncope, but still having dizzines for several yrs - 4 episodes in dec., had worst ever month in December, recalls an episode where she felt lightheaded, told mom she was going to pass out, did lose consiousness briefly but caught her, so no injury. Was sat down and improved, but being helped in the car became confused  Another time with shopping had to be pushed in a scooter at a store with a friend.  Dizzy episodes are notable for usually migraine after.  Has seen Dr Graciela HusbandsKlein in past - s/p 30 day monitor,EKG, echo (no tilt table).  Dx with PVC's per pt and dysautonomia.  Pt requests neuro eval as she ? Whether sz related. Was not placed on midodrine or similar.   Pt denies neurological change such as new headache, facial or extremity weakness.  Pt denies polydipsia, polyuria, or low sugar symptoms. Pt states overall good compliance with treatment and medications, good tolerability, and has been trying to follow appropriate diet.  Pt denies worsening depressive symptoms, suicidal ideation or panic. No fever, night sweats, wt loss, loss of appetite, or other constitutional symptoms.  Pt states good ability with ADL's, has low fall risk, home safety reviewed and adequate, no other significant changes in hearing or vision, and only occasionally active with exercise.  Pt denies fever, wt loss, night sweats, loss of appetite, or other constitutional symptoms  Dizziness episode occurre one time with driving 10 yrs ago when pregnant.  Declines flu shot Wt Readings from Last 3 Encounters:  04/24/15 121 lb (54.885 kg)  12/02/14 116 lb (52.617 kg)  08/25/14 119 lb (53.978 kg)   Past Medical History  Diagnosis Date  . Acute pharyngitis 03/23/2010  . ANXIETY DEPRESSION  01/02/2009  . CARDIAC ARRHYTHMIA 01/12/2009  . COMMON MIGRAINE 01/02/2009  . PREMATURE VENTRICULAR CONTRACTIONS 02/21/2009  . SINUSITIS- ACUTE-NOS 08/15/2009  . SYNCOPE 01/02/2009   Past Surgical History  Procedure Laterality Date  . S/p rhinoplasty  2009    reports that she has quit smoking. She does not have any smokeless tobacco history on file. She reports that she drinks alcohol. Her drug history is not on file. family history is not on file. Allergies  Allergen Reactions  . Moxifloxacin Rash  . Sertraline Hcl Anxiety   Review of Systems Constitutional: Negative for increased diaphoresis, other activity, appetite or siginficant weight change other than noted HENT: Negative for worsening hearing loss, ear pain, facial swelling, mouth sores and neck stiffness.   Eyes: Negative for other worsening pain, redness or visual disturbance.  Respiratory: Negative for shortness of breath and wheezing  Cardiovascular: Negative for chest pain and palpitations.  Gastrointestinal: Negative for diarrhea, blood in stool, abdominal distention or other pain Genitourinary: Negative for hematuria, flank pain or change in urine volume.  Musculoskeletal: Negative for myalgias or other joint complaints.  Skin: Negative for color change and wound or drainage.  Neurological: Negative for syncope and numbness. other than noted Hematological: Negative for adenopathy. or other swelling Psychiatric/Behavioral: Negative for hallucinations, SI, self-injury, decreased concentration or other worsening agitation.      Objective:   Physical Exam BP 104/60 mmHg  Pulse 87  Temp(Src) 97.7 F (36.5 C) (Oral)  Ht 5\' 7"  (1.702  m)  Wt 121 lb (54.885 kg)  BMI 18.95 kg/m2  SpO2 98% .VS noted,  Constitutional: Pt is oriented to person, place, and time. Appears well-developed and well-nourished, in no significant distress Head: Normocephalic and atraumatic.  Right Ear: External ear normal.  Left Ear: External ear  normal.  Nose: Nose normal.  Mouth/Throat: Oropharynx is clear and moist.  Eyes: Conjunctivae and EOM are normal. Pupils are equal, round, and reactive to light.  Neck: Normal range of motion. Neck supple. No JVD present. No tracheal deviation present or significant neck LA or mass Cardiovascular: Normal rate, regular rhythm, normal heart sounds and intact distal pulses.   Pulmonary/Chest: Effort normal and breath sounds without rales or wheezing  Abdominal: Soft. Bowel sounds are normal. NT. No HSM  Musculoskeletal: Normal range of motion. Exhibits no edema.  Lymphadenopathy:  Has no cervical adenopathy.  Neurological: Pt is alert and oriented to person, place, and time. Pt has normal reflexes. No cranial nerve deficit. Motor grossly intact Skin: Skin is warm and dry. No rash noted.  Psychiatric:  Has nervous mood and affect. Behavior is normal.     Assessment & Plan:

## 2015-04-24 NOTE — Progress Notes (Signed)
Pre visit review using our clinic review tool, if applicable. No additional management support is needed unless otherwise documented below in the visit note. 

## 2015-04-25 NOTE — Assessment & Plan Note (Signed)
With known hx of dysautonomia, symptoms worsening freq recently, concern for occurrence with driving increased, for second opinion card consult - [facility], also neuro referral, ? Midodrine or similar tx needed, or driving restriction?

## 2015-04-25 NOTE — Assessment & Plan Note (Signed)
Mild to mod, declines tx such as ssri for now, to f/u any worsening symptoms or concerns

## 2015-04-25 NOTE — Assessment & Plan Note (Signed)
Overall stable overall by history and exam, and pt to continue medical treatment as before,  to f/u any worsening symptoms or concerns  

## 2015-05-01 ENCOUNTER — Encounter: Payer: Self-pay | Admitting: Neurology

## 2015-05-01 ENCOUNTER — Ambulatory Visit (INDEPENDENT_AMBULATORY_CARE_PROVIDER_SITE_OTHER): Payer: 59 | Admitting: Neurology

## 2015-05-01 VITALS — BP 130/70 | HR 111 | Ht 67.0 in | Wt 120.0 lb

## 2015-05-01 DIAGNOSIS — G43009 Migraine without aura, not intractable, without status migrainosus: Secondary | ICD-10-CM

## 2015-05-01 DIAGNOSIS — R55 Syncope and collapse: Secondary | ICD-10-CM

## 2015-05-01 DIAGNOSIS — F05 Delirium due to known physiological condition: Secondary | ICD-10-CM

## 2015-05-01 NOTE — Patient Instructions (Addendum)
The passing out spells are likely cardiac related/due to dysautonomia.  However, we will perform tests to look for other possible neurologic causes 1.  I would have the MRI of the brain with and without contrast [facility] 8[facility] [place] [phone])  2.  We will get a routine EEG to record your brain waves 3.  I strongly recommend that you see the cardiologist for a re-evaluation 4.  Follow up in 3 months/or [facility]

## 2015-05-01 NOTE — Progress Notes (Signed)
NEUROLOGY CONSULTATION NOTE  Kathy Mccoy MRN: [id number] DOB: 1981-03-11  Referring provider: Dr. Jonny Ruiz Primary care provider: Dr. Jonny Ruiz  Reason for consult:  Syncope  HISTORY OF PRESENT ILLNESS: Kathy Mccoy is a 35 year old right-handed female who presents for dizziness and syncope.  History obtained by patient, PCP note, and cardiology note.  Echo report, EKG and labs reviewed.  She has had recurrent episodes of syncope or near-syncope for over 10 years.  She reports feeling lightheaded, diaphoretic, seeing spots and graying out of vision, and palpitations.  It usually occurs suddenly and spontaneously.  Usually, she is able to sit herself down to recover.  However, she does sometimes pass out, which is brief.  Sometimes, she wakes up with a migraine.  She underwent a cardiology evaluation in 2010.  At that time, she was noted to have a cardiac arrhythmia on exam.  She also exhibited positive orthostatics with a 20 point drop in systolic blood pressure when standing.  She did have an event monitor which revealed asymptomatic PVCs.  An echocardiogram showed a LV EF 60-65% with no regional wall motion abnormalities.  She was diagnosed with dysautonomic syncope.    She has episodes of dizziness daily.  She has an episode of syncope about once a week.  Often, she has a migraine headache after a syncopal event.  She has history of migraines for many years.  The are pounding 7-10/10 bi-temporal/retro-orbital pain, associated with nausea, photophobia and phonophobia.  They usually last one hour with sumatriptan 100mg . She has migraines separately as well.     Last month, she had a near-syncopal episode in the mall.  This episode was a little different.  She reports feeling confused afterwards.  She didn't know where she was and didn't recognize her car.  Her mother had to reassure her.  This lasted about 20 minutes.  EKG from 04/24/15 showed NSR 73 bpm.  An MRI of the brain was ordered by her  PCP.  Recent CBC, BMP and LFT were unremarkable.  She denies prior history of head trauma or seizures.  She had a normal birth.  She denies family history of migraine or seizures.  PAST MEDICAL HISTORY: Past Medical History  Diagnosis Date  . Acute pharyngitis 03/23/2010  . ANXIETY DEPRESSION 01/02/2009  . CARDIAC ARRHYTHMIA 01/12/2009  . COMMON MIGRAINE 01/02/2009  . PREMATURE VENTRICULAR CONTRACTIONS 02/21/2009  . SINUSITIS- ACUTE-NOS 08/15/2009  . SYNCOPE 01/02/2009    PAST SURGICAL HISTORY: Past Surgical History  Procedure Laterality Date  . S/p rhinoplasty  2009    MEDICATIONS: Current Outpatient Prescriptions on File Prior to Visit  Medication Sig Dispense Refill  . SUMAtriptan (IMITREX) 100 MG tablet TAKE 1 TABLET BY MOUTH EVERY OTHER DAY AS NEEDED FOR MIGRAINE 9 tablet 5   No current facility-administered medications on file prior to visit.    ALLERGIES: Allergies  Allergen Reactions  . Moxifloxacin Rash  . Sertraline Hcl Anxiety    FAMILY HISTORY: No family history on file.  SOCIAL HISTORY: Social History   Social History  . Marital Status: Single    Spouse Name: N/A  . Number of Children: 2  . Years of Education: N/A   Occupational History  . brokerage admin support    Social History Main Topics  . Smoking status: Former Games developer  . Smokeless tobacco: Not on file  . Alcohol Use: Yes     Comment: rare  . Drug Use: Not on file  . Sexual Activity: Not on file  Other Topics Concern  . Not on file   Social History Narrative    REVIEW OF SYSTEMS: Constitutional: No fevers, chills, or sweats, no generalized fatigue, change in appetite Eyes: No visual changes, double vision, eye pain Ear, nose and throat: No hearing loss, ear pain, nasal congestion, sore throat Cardiovascular: No chest pain, palpitations Respiratory:  No shortness of breath at rest or with exertion, wheezes GastrointestinaI: No nausea, vomiting, diarrhea, abdominal pain, fecal  incontinence Genitourinary:  No dysuria, urinary retention or frequency Musculoskeletal:  No neck pain, back pain Integumentary: No rash, pruritus, skin lesions Neurological: as above Psychiatric: No depression, insomnia, anxiety Endocrine: No palpitations, fatigue, diaphoresis, mood swings, change in appetite, change in weight, increased thirst Hematologic/Lymphatic:  No anemia, purpura, petechiae. Allergic/Immunologic: no itchy/runny eyes, nasal congestion, recent allergic reactions, rashes  PHYSICAL EXAM: Filed Vitals:   05/01/15 1008 05/01/15 1009  BP: 128/72 130/70  Pulse:    Pulse:  111 bpm Supine:  124/66, Sitting:  128/72, Standing:  130/70 Ht 5'7", Wt 120 lbs General: No acute distress.  Patient appears well-groomed.  Head:  Normocephalic/atraumatic Eyes:  fundi unremarkable, without vessel changes, exudates, hemorrhages or papilledema. Neck: supple, no paraspinal tenderness, full range of motion Back: No paraspinal tenderness Heart: regular rate and rhythm Lungs: Clear to auscultation bilaterally. Vascular: No carotid bruits. Neurological Exam: Mental status: alert and oriented to person, place, and time, recent and remote memory intact, fund of knowledge intact, attention and concentration intact, speech fluent and not dysarthric, language intact. Cranial nerves: CN I: not tested CN II: pupils equal, round and reactive to light, visual fields intact, fundi unremarkable, without vessel changes, exudates, hemorrhages or papilledema. CN III, IV, VI:  full range of motion, no nystagmus, no ptosis CN V: facial sensation intact CN VII: upper and lower face symmetric CN VIII: hearing intact CN IX, X: gag intact, uvula midline CN XI: sternocleidomastoid and trapezius muscles intact CN XII: tongue midline Bulk & Tone: normal, no fasciculations. Motor:  5/5 throughout  Sensation: temperature and vibration sensation intact. Deep Tendon Reflexes:  2+ throughout, toes  downgoing.  Finger to nose testing:  Without dysmetria.  Heel to shin:  Without dysmetria.  Gait:  Normal station and stride.  Able to turn and tandem walk. Romberg negative.  IMPRESSION: Syncope Migraine without aura  When she was evaluated by the cardiologist in 2010, she did demonstrate an irregular heart beat.  She also had positive orthostatics, although she did not demonstrate that today.  Semiology appears consistent with neural-mediated syncope.  Dizziness and syncope may be symptoms of migraine, but I suspect that the migraine is triggered by the syncopal episodes.  Semiology is not consistent with seizures.  She did report 20 minute episode of confusion last month, so we will evaluate further with EEG.  However, I suspect she had a panic attack.  PLAN: 1.  Will get routine EEG to evaluate for any abnormalities to suggest increased risk for seizures.  If EEG is abnormal, she will most definitely need MRI of the brain.  However, she already has an MRI ordered. 2.  She should make an appointment with cardiology, as cardiac evaluation is imperative.   3.  She should follow up in 3 months or after cardiology assessment.  Thank you for allowing me to take part in the care of this patient.  Shon MilletAdam Jaffe, DO  CC:  Oliver BarreJames John, MD

## 2015-05-09 ENCOUNTER — Ambulatory Visit (INDEPENDENT_AMBULATORY_CARE_PROVIDER_SITE_OTHER): Payer: 59 | Admitting: Neurology

## 2015-05-09 DIAGNOSIS — R55 Syncope and collapse: Secondary | ICD-10-CM | POA: Diagnosis not present

## 2015-05-10 ENCOUNTER — Telehealth: Payer: Self-pay | Admitting: *Deleted

## 2015-05-10 ENCOUNTER — Telehealth: Payer: Self-pay | Admitting: Neurology

## 2015-05-10 DIAGNOSIS — Z87898 Personal history of other specified conditions: Secondary | ICD-10-CM | POA: Insufficient documentation

## 2015-05-10 DIAGNOSIS — R000 Tachycardia, unspecified: Secondary | ICD-10-CM

## 2015-05-10 DIAGNOSIS — I951 Orthostatic hypotension: Secondary | ICD-10-CM

## 2015-05-10 DIAGNOSIS — G90A Postural orthostatic tachycardia syndrome (POTS): Secondary | ICD-10-CM | POA: Insufficient documentation

## 2015-05-10 NOTE — Telephone Encounter (Signed)
Tried returning call. No answer, msg states customer not available.

## 2015-05-10 NOTE — Telephone Encounter (Signed)
I spoke with patient and notified her of normal EEG result and the recommendation to proceed with brain mri. She was questioning if she should even have the mri since her eeg was normal and she states she was told  that her issues seem to be cardiac and she needs to see a cardiologist which she is seeing today @ [facility]. I explained to her that Dr. Everlena Cooper wanted her to proceed with MRI to make sure there are no abnormalities seen with the brain and it would complete her cardiology work-up. She states she's not sure if she will proceed with the mri she's still not sure if it's absolutely necessary. She states she will talk with the cardiologist today and see what his recommendations are for the mri and she will decide then. I told her to let us know what she is going to do and we can cancel the mri if need be.

## 2015-05-10 NOTE — Telephone Encounter (Signed)
Patient is wondering why MRI is being scheduled and she hasn't even gotten EEG results back yet.

## 2015-05-10 NOTE — Telephone Encounter (Signed)
EEG is normal.  However, her PCP had wanted an MRI of the brain.  Since it is already scheduled, I would get the MRI since it would complete a neurologic workup.

## 2015-05-10 NOTE — Procedures (Signed)
ELECTROENCEPHALOGRAM REPORT  Date of Study: 05/09/2015  Patient's Name: Kathy Mccoy MRN: [id number] Date of Birth: 1980-07-31  Indication: syncope and migraine  Medications: Sumatriptan  Technical Summary: This is a multichannel digital EEG recording, using the international 10-20 placement system with electrodes applied with paste and impedances below 5000 ohms.    Description: The EEG background is symmetric, with a well-developed posterior dominant rhythm of 11-12 Hz, which is reactive to eye opening and closing.  Diffuse beta activity is seen, with a bilateral frontal preponderance.  No focal or generalized abnormalities are seen.  No focal or generalized epileptiform discharges are seen.  Stage II sleep is seen, with normal and symmetric sleep patterns.  Hyperventilation and photic stimulation were performed, and produced no abnormalities.  ECG revealed normal cardiac rate and rhythm.  Impression: This is a normal routine EEG of the awake and asleep states, with activating procedures.  A normal study does not rule out the possibility of a seizure disorder in this patient.  Abdirahim Flavell R. Everlena Cooper, DO

## 2015-05-10 NOTE — Telephone Encounter (Signed)
Tiffany spoke with patient

## 2015-05-10 NOTE — Telephone Encounter (Signed)
Pt wants to talk to Dr Everlena Cooper ASAP about her EEG  She does not want to go to [facility] if she does not have to please call [phone]

## 2015-05-10 NOTE — Telephone Encounter (Signed)
She states that she was unsure why she got a call about having a MRI done. She thought she was only having one done if the EEG result showed something and she has not gotten the results back yet from the EEG. She is going to [facility] today to see a cardiologist please call her before 2:00 today [phone]

## 2015-05-16 ENCOUNTER — Ambulatory Visit (HOSPITAL_COMMUNITY): Payer: 59

## 2015-05-17 ENCOUNTER — Other Ambulatory Visit: Payer: 59

## 2015-07-18 ENCOUNTER — Encounter (INDEPENDENT_AMBULATORY_CARE_PROVIDER_SITE_OTHER): Payer: Self-pay

## 2015-08-16 ENCOUNTER — Ambulatory Visit: Payer: 59 | Admitting: Neurology

## 2015-11-26 ENCOUNTER — Telehealth: Payer: Self-pay

## 2015-11-26 NOTE — Telephone Encounter (Signed)
Pt will come in on Friday.

## 2015-11-26 NOTE — Telephone Encounter (Signed)
She needs to make a follow up appointment first to further discuss current symptoms.

## 2015-11-26 NOTE — Telephone Encounter (Signed)
Pt called to request that MRI be reordered. Pt stated she was seen in January for syncope and migraines, but at that time decided to hold off on MRI until after she was seen by a cardiologist. [facility] There pt was dx with POTS. Pt stated that she would like to MRI rescheduled now for evaluation of her migraines. Pt states that over the last few weeks she has had 2 migraines with symptoms she's never experienced before and it's concerning. Pt states she has experienced a recent nose bleed with her migraines, L eye swelling, burst eye vessels, hand numbness that did not radiate and lasted roughly 1 hour. Pt continues to have nausea, vomiting, photophobia and phonophobia. Please advise.

## 2015-11-30 ENCOUNTER — Encounter: Payer: Self-pay | Admitting: Neurology

## 2015-11-30 ENCOUNTER — Ambulatory Visit (INDEPENDENT_AMBULATORY_CARE_PROVIDER_SITE_OTHER): Payer: 59 | Admitting: Neurology

## 2015-11-30 VITALS — BP 108/56 | HR 95 | Ht 67.0 in | Wt 119.3 lb

## 2015-11-30 DIAGNOSIS — R202 Paresthesia of skin: Secondary | ICD-10-CM

## 2015-11-30 DIAGNOSIS — R200 Anesthesia of skin: Secondary | ICD-10-CM

## 2015-11-30 DIAGNOSIS — R51 Headache: Secondary | ICD-10-CM | POA: Diagnosis not present

## 2015-11-30 DIAGNOSIS — G43009 Migraine without aura, not intractable, without status migrainosus: Secondary | ICD-10-CM | POA: Diagnosis not present

## 2015-11-30 DIAGNOSIS — R519 Headache, unspecified: Secondary | ICD-10-CM

## 2015-11-30 MED ORDER — NAPROXEN 500 MG PO TABS: 500.0000 mg | ORAL_TABLET | Freq: Two times a day (BID) | ORAL | 2 refills | Status: DC | PRN

## 2015-11-30 NOTE — Patient Instructions (Addendum)
1.  We will get MRI of brain to look for anything that may be causing increased migraines. -- MRI SCHEDULED FOR 12/10/15 @ 3 P.M. [address]@ [facility] ([address] [place], [place] [address] ph: [phone] fax: [phone]) 2.  When you get a migraine, take naproxen 500mg  at earliest onset.  May repeat dose in 12 hours if needed.  Limit use to no more than 2 days out of the week.  Stop Vicodin 3.  Pending results of MRI, we will decide on any further medications 4.  Follow up

## 2015-11-30 NOTE — Progress Notes (Signed)
NEUROLOGY FOLLOW UP OFFICE NOTE  Kathy Mccoy [id number]  HISTORY OF PRESENT ILLNESS: Kathy Mccoy is a 35 year old right-handed woman who follows up for migraine.  EEG from 05/09/15 was normal.  She was evaluated for syncope and was diagnosed with POTS.  She was told to increase sodium and water intake and to monitor events.  She does get lightheaded at times.  She presents for changes in her migraines.    She has history of migraines since highschool, but they were infrequent, about once every few months.  Over the past 3 months, they have increased in frequency and presented with new symptoms as well.  They are retro-orbital, either side, pounding, and 10/10 intensity.  They last 4 to 6 hours and have been occurring 2 to 4 times a month.  They are accompanied by nausea, photophobia, lightheadedness, diaphoresis and sometimes blurred vision.  They are not positional.  Cold makes it worse and heat helps.  She had two migraines with new symptoms.  One migraine was accompanied by nose bleed.  Another time, she developed numbness and tingling of the right hand while driving, which lasted an hour.  She has taken 1 to 2 Vicodin which works.  Sumatriptan, Tylenol, and Motrin were ineffective.  PAST MEDICAL HISTORY: Past Medical History:  Diagnosis Date  . Acute pharyngitis 03/23/2010  . ANXIETY DEPRESSION 01/02/2009  . CARDIAC ARRHYTHMIA 01/12/2009  . COMMON MIGRAINE 01/02/2009  . PREMATURE VENTRICULAR CONTRACTIONS 02/21/2009  . SINUSITIS- ACUTE-NOS 08/15/2009  . SYNCOPE 01/02/2009    MEDICATIONS: Current Outpatient Prescriptions on File Prior to Visit  Medication Sig Dispense Refill  . SUMAtriptan (IMITREX) 100 MG tablet TAKE 1 TABLET BY MOUTH EVERY OTHER DAY AS NEEDED FOR MIGRAINE (Patient not taking: Reported on 11/30/2015) 9 tablet 5   No current facility-administered medications on file prior to visit.     ALLERGIES: Allergies  Allergen Reactions  . Moxifloxacin Rash and Hives  .  Sertraline Hcl Anxiety    FAMILY HISTORY: No family history on file.  No history of migraines  SOCIAL HISTORY: Social History   Social History  . Marital status: Single    Spouse name: N/A  . Number of children: 2  . 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 Smoking status: Former Games developer  . Smokeless tobacco: Never Used  . Alcohol use Yes     Comment: rare  . Drug use: Unknown  . Sexual activity: Not on file   Other Topics Concern  . Not on file   Social History Narrative  . No narrative on file    REVIEW OF SYSTEMS: Constitutional: No fevers, chills, or sweats, no generalized fatigue, change in appetite Eyes: No visual changes, double vision, eye pain Ear, nose and throat: No hearing loss, ear pain, nasal congestion, sore throat Cardiovascular: No chest pain, palpitations Respiratory:  No shortness of breath at rest or with exertion, wheezes GastrointestinaI: No nausea, vomiting, diarrhea, abdominal pain, fecal incontinence Genitourinary:  No dysuria, urinary retention or frequency Musculoskeletal:  No neck pain, back pain Integumentary: No rash, pruritus, skin lesions Neurological: as above Psychiatric: No depression, insomnia, anxiety Endocrine: No palpitations, fatigue, diaphoresis, mood swings, change in appetite, change in weight, increased thirst Hematologic/Lymphatic:  No purpura, petechiae. Allergic/Immunologic: no itchy/runny eyes, nasal congestion, recent allergic reactions, rashes  PHYSICAL EXAM: Vitals:   11/30/15 0924  BP: (!) 108/56  Pulse: 95   General:  No acute distress.  Patient appears well-groomed.  normal body habitus. Head:  Normocephalic/atraumatic Eyes:  Fundi examined but not visualized Neck: supple, no paraspinal tenderness, full range of motion Heart:  Regular rate and rhythm Lungs:  Clear to auscultation bilaterally Back: No paraspinal  tenderness Neurological Exam: alert and oriented to person, place, and time. Attention span and concentration intact, recent and remote memory intact, fund of knowledge intact.  Speech fluent and not dysarthric, language intact.  CN II-XII intact. Bulk and tone normal, muscle strength 5/5 throughout.  Sensation to light touch, temperature and vibration intact.  Deep tendon reflexes 2+ throughout, toes downgoing.  Finger to nose and heel to shin testing intact.  Gait normal, Romberg negative.  IMPRESSION: Increased migraine frequency with new symptoms such as right hand numbness  PLAN: 1.  MRI of brain with and without contrast and MRA of head to look for secondary etiology. 2.  Will try naproxen 500mg  for abortive therapy.  Advised to stop Vicodin 3.  Follow up after MRI.  Pending results of MRI, will then determine whether to start a preventative or other abortive medication.  26 minutes spent face to face with patient, over 50% spent counseling.  Shon MilletAdam Hollie Bartus, DO  CC:  Oliver BarreJames John, MD

## 2015-12-06 ENCOUNTER — Encounter (HOSPITAL_COMMUNITY): Payer: 59

## 2015-12-06 ENCOUNTER — Ambulatory Visit (HOSPITAL_COMMUNITY)
Admission: RE | Admit: 2015-12-06 | Discharge: 2015-12-06 | Disposition: A | Discharge status: Home / Self Care | Payer: 59 | Source: Ambulatory Visit | Attending: Neurology | Admitting: Neurology

## 2015-12-06 DIAGNOSIS — R202 Paresthesia of skin: Secondary | ICD-10-CM | POA: Diagnosis not present

## 2015-12-06 DIAGNOSIS — G43009 Migraine without aura, not intractable, without status migrainosus: Secondary | ICD-10-CM | POA: Diagnosis not present

## 2015-12-06 DIAGNOSIS — R9089 Other abnormal findings on diagnostic imaging of central nervous system: Secondary | ICD-10-CM | POA: Insufficient documentation

## 2015-12-06 DIAGNOSIS — R200 Anesthesia of skin: Secondary | ICD-10-CM

## 2015-12-06 DIAGNOSIS — R51 Headache: Secondary | ICD-10-CM

## 2015-12-06 DIAGNOSIS — R519 Headache, unspecified: Secondary | ICD-10-CM

## 2015-12-06 MED ORDER — GADOBENATE DIMEGLUMINE 529 MG/ML IV SOLN
15.0000 mL | Freq: Once | INTRAVENOUS | Status: AC | PRN
  Administered 2015-12-06: 11 mL via INTRAVENOUS

## 2015-12-10 ENCOUNTER — Other Ambulatory Visit: Payer: 59

## 2015-12-13 ENCOUNTER — Encounter: Payer: Self-pay | Admitting: Neurology

## 2015-12-13 ENCOUNTER — Ambulatory Visit (INDEPENDENT_AMBULATORY_CARE_PROVIDER_SITE_OTHER): Payer: 59 | Admitting: Neurology

## 2015-12-13 VITALS — BP 120/78 | HR 91 | Ht 67.0 in | Wt 118.0 lb

## 2015-12-13 DIAGNOSIS — G43009 Migraine without aura, not intractable, without status migrainosus: Secondary | ICD-10-CM

## 2015-12-13 MED ORDER — RIZATRIPTAN BENZOATE 10 MG PO TABS: ORAL_TABLET | ORAL | 3 refills | Status: DC

## 2015-12-13 NOTE — Patient Instructions (Signed)
Migraine Recommendations: 1.  We won't start a daily preventative medication 2.  Take Maxalt 10mg  at earliest onset of headache.  May repeat dose once in 2 hours if needed.  Do not exceed two tablets in 24 hours. 3.  Limit use of pain relievers to no more than 2 days out of the week.  These medications include acetaminophen, ibuprofen, triptans and narcotics.  This will help reduce risk of rebound headaches. 4.  Be aware of common food triggers such as processed sweets, processed foods with nitrites (such as deli meat, hot dogs, sausages), foods with MSG, alcohol (such as wine), chocolate, certain cheeses, certain fruits (dried fruits, some citrus fruit), vinegar, diet soda. 4.  Avoid caffeine 5.  Routine exercise 6.  Proper sleep hygiene 7.  Stay adequately hydrated with water 8.  Keep a headache diary. 9.  Maintain proper stress management. 10.  Do not skip meals. 11.  Consider supplements:  Magnesium oxide 400mg  to 600mg  daily, riboflavin 400mg , Coenzyme Q 10 100mg  three times daily 12.  Follow up in 3 months.

## 2015-12-13 NOTE — Progress Notes (Signed)
NEUROLOGY FOLLOW UP OFFICE NOTE  Kathy Mccoy [id number]  HISTORY OF PRESENT ILLNESS: Kathy Mccoy is a 35 year old right-handed woman with migraines and POTS who follows up for migraine.  She is accompanied by her mother who supplements history.  UPDATE: She had an MRI and MRA of head with and without contrast on 12/06/15 which was personally reviewed and unremarkable.  She has had one migraine since last visit.  Sumatriptan was ineffective.  She did not try naproxen.  She took Valium, which worked.   HISTORY: She has history of migraines since highschool, but they were infrequent, about once every few months.  Over the past 3 months, they have increased in frequency and presented with new symptoms as well.  They are retro-orbital, either side, pounding, and 10/10 intensity.  They last 4 to 6 hours and have been occurring 2 to 4 times a month.  They are accompanied by nausea, photophobia, lightheadedness, diaphoresis and sometimes blurred vision.  They are not positional.  Cold makes it worse and heat helps.  She had two migraines with new symptoms.  One migraine was accompanied by nose bleed.    PAST MEDICAL HISTORY: Past Medical History:  Diagnosis Date  . Acute pharyngitis 03/23/2010  . ANXIETY DEPRESSION 01/02/2009  . CARDIAC ARRHYTHMIA 01/12/2009  . COMMON MIGRAINE 01/02/2009  . PREMATURE VENTRICULAR CONTRACTIONS 02/21/2009  . SINUSITIS- ACUTE-NOS 08/15/2009  . SYNCOPE 01/02/2009    MEDICATIONS: Current Outpatient Prescriptions on File Prior to Visit  Medication Sig Dispense Refill  . ibuprofen (ADVIL,MOTRIN) 200 MG tablet Take by mouth.    . naproxen (NAPROSYN) 500 MG tablet Take 1 tablet (500 mg total) by mouth every 12 (twelve) hours as needed. 16 tablet 2   No current facility-administered medications on file prior to visit.     ALLERGIES: Allergies  Allergen Reactions  . Moxifloxacin Rash and Hives  . Sertraline Hcl Anxiety    FAMILY HISTORY: No history of  seizures  SOCIAL HISTORY: Social History   Social History  . Marital status: Single    Spouse name: N/A  . Number of children: 2  . 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 Smoking status: Former Games developermoker  . Smokeless tobacco: Never Used  . Alcohol use Yes     Comment: rare  . Drug use: Unknown  . Sexual activity: Not on file   Other Topics Concern  . Not on file   Social History Narrative  . No narrative on file    REVIEW OF SYSTEMS: Constitutional: No fevers, chills, or sweats, no generalized fatigue, change in appetite Eyes: No visual changes, double vision, eye pain Ear, nose and throat: No hearing loss, ear pain, nasal congestion, sore throat Cardiovascular: No chest pain, palpitations Respiratory:  No shortness of breath at rest or with exertion, wheezes GastrointestinaI: No nausea, vomiting, diarrhea, abdominal pain, fecal incontinence Genitourinary:  No dysuria, urinary retention or frequency Musculoskeletal:  No neck pain, back pain Integumentary: No rash, pruritus, skin lesions Neurological: as above Psychiatric: No depression, insomnia, anxiety Endocrine: No palpitations, fatigue, diaphoresis, mood swings, change in appetite, change in weight, increased thirst Hematologic/Lymphatic:  No purpura, petechiae. Allergic/Immunologic: no itchy/runny eyes, nasal congestion, recent allergic reactions, rashes  PHYSICAL EXAM: Vitals:   12/13/15 0933  BP: 120/78  Pulse: 91   General: No acute distress.  Patient appears well-groomed.  normal body habitus. Head:  Normocephalic/atraumatic  IMPRESSION: Migraine  PLAN: 1.  Will try Maxalt 10mg  for abortive therapy 2.  Since migraines are infrequent, will not initiate a preventative medication at this time. 3.  Follow up in 3 months.  25 minutes spent face to face with patient, 100% spent counseling and reviewing MRI results.  Shon MilletAdam  Clifford Benninger, DO  CC:  Oliver BarreJames John, MD

## 2016-02-02 ENCOUNTER — Ambulatory Visit (INDEPENDENT_AMBULATORY_CARE_PROVIDER_SITE_OTHER): Payer: 59 | Admitting: Family Medicine

## 2016-02-02 ENCOUNTER — Encounter: Payer: Self-pay | Admitting: Family Medicine

## 2016-02-02 VITALS — BP 90/68 | HR 82 | Temp 98.2°F | Resp 16 | Ht 67.0 in | Wt 115.7 lb

## 2016-02-02 DIAGNOSIS — J0110 Acute frontal sinusitis, unspecified: Secondary | ICD-10-CM

## 2016-02-02 MED ORDER — AMOXICILLIN 875 MG PO TABS: 875.0000 mg | ORAL_TABLET | Freq: Two times a day (BID) | ORAL | 0 refills | Status: DC

## 2016-02-02 NOTE — Progress Notes (Signed)
SUBJECTIVE:  Kathy Mccoy is a 35 y.o. female pt of Dr. Jonny RuizJohn, new to me, who present to weekend clinic with complaint of coryza, congestion, sore throat and bilateral sinus pain for 7 days. She denies a history of anorexia and chest pain and denies a history of asthma. Patient admits to smoke cigarettes.   Current Outpatient Prescriptions on File Prior to Visit  Medication Sig Dispense Refill  . rizatriptan (MAXALT) 10 MG tablet Take 1 tablet.  May once repeat in 2 hours if needed.  Do not exceed 2 tablets in 24 hours 10 tablet 3  . ibuprofen (ADVIL,MOTRIN) 200 MG tablet Take by mouth.    . naproxen (NAPROSYN) 500 MG tablet Take 1 tablet (500 mg total) by mouth every 12 (twelve) hours as needed. (Patient not taking: Reported on 02/02/2016) 16 tablet 2   No current facility-administered medications on file prior to visit.     Allergies  Allergen Reactions  . Moxifloxacin Rash and Hives  . Sertraline Hcl Anxiety    Past Medical History:  Diagnosis Date  . Acute pharyngitis 03/23/2010  . ANXIETY DEPRESSION 01/02/2009  . CARDIAC ARRHYTHMIA 01/12/2009  . COMMON MIGRAINE 01/02/2009  . PREMATURE VENTRICULAR CONTRACTIONS 02/21/2009  . SINUSITIS- ACUTE-NOS 08/15/2009  . SYNCOPE 01/02/2009    Past Surgical History:  Procedure Laterality Date  . s/p rhinoplasty  2009    No family history on file.  Social History   Social History  . Marital status: Single    Spouse name: N/A  . Number of children: 2  . 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 Smoking status: Former Games developermoker  . Smokeless tobacco: Never Used  . Alcohol use Yes     Comment: rare  . Drug use: Unknown  . Sexual activity: Not on file   Other Topics Concern  . Not on file   Social History Narrative  . No narrative on file   The PMH, PSH, Social History, Family History, Medications, and [facility], and have been  updated if relevant.  OBJECTIVE: BP 90/68 (BP Location: Left Arm, Patient Position: Sitting, Cuff Size: Normal)   Pulse 82   Temp 98.2 F (36.8 C) (Oral)   Resp 16   Ht 5\' 7"  (1.702 m)   Wt 115 lb 11.2 oz (52.5 kg)   SpO2 97%   BMI 18.12 kg/m   She appears well, vital signs are as noted. Ears normal.  Throat and pharynx normal.  Neck supple. No adenopathy in the neck. Nose is congested. Sinuses non tender. The chest is clear, without wheezes or rales.  ASSESSMENT:  sinusitis  PLAN: Given duration and progression of symptoms, will treat for bacterial sinusitis.  Symptomatic therapy suggested: push fluids, rest and return office visit prn if symptoms persist or worsen.. Call or return to clinic prn if these symptoms worsen or fail to improve as anticipated.

## 2016-02-02 NOTE — Patient Instructions (Signed)
Great to meet you.  Take antibiotic as directed.  Drink lots of fluids.    Treat sympotmatically with Mucinex, nasal saline irrigation, and Tylenol/Ibuprofen.   Also try an antihistamine/decongestant like claritin D or zyrtec D over the counter- two times a day as needed ( have to sign for them at pharmacy).   Try over the counter nasocort-start with 2 sprays per nostril per day...and then try to taper to 1 spray per nostril once symptoms improve.   You can use warm compresses.  Cough suppressant at night.   Call if not improving as expected in 5-7 days.

## 2016-02-02 NOTE — Progress Notes (Signed)
Pre visit review using our clinic review tool, if applicable. No additional management support is needed unless otherwise documented below in the visit note. 

## 2016-02-28 ENCOUNTER — Ambulatory Visit: Payer: 59 | Admitting: Neurology

## 2016-04-24 ENCOUNTER — Ambulatory Visit: Payer: 59 | Admitting: Neurology

## 2016-11-30 DIAGNOSIS — J069 Acute upper respiratory infection, unspecified: Secondary | ICD-10-CM | POA: Diagnosis not present

## 2017-01-31 ENCOUNTER — Emergency Department (HOSPITAL_COMMUNITY)
Admission: EM | Admit: 2017-01-31 | Discharge: 2017-01-31 | Disposition: A | Discharge status: Home / Self Care | Payer: 59 | Attending: Emergency Medicine | Admitting: Emergency Medicine

## 2017-01-31 ENCOUNTER — Emergency Department (HOSPITAL_COMMUNITY): Payer: 59

## 2017-01-31 ENCOUNTER — Encounter (HOSPITAL_COMMUNITY): Payer: Self-pay

## 2017-01-31 DIAGNOSIS — Z79899 Other long term (current) drug therapy: Secondary | ICD-10-CM | POA: Insufficient documentation

## 2017-01-31 DIAGNOSIS — Y999 Unspecified external cause status: Secondary | ICD-10-CM | POA: Insufficient documentation

## 2017-01-31 DIAGNOSIS — Y929 Unspecified place or not applicable: Secondary | ICD-10-CM | POA: Insufficient documentation

## 2017-01-31 DIAGNOSIS — S29012A Strain of muscle and tendon of back wall of thorax, initial encounter: Secondary | ICD-10-CM | POA: Diagnosis not present

## 2017-01-31 DIAGNOSIS — S46812A Strain of other muscles, fascia and tendons at shoulder and upper arm level, left arm, initial encounter: Secondary | ICD-10-CM

## 2017-01-31 DIAGNOSIS — Y939 Activity, unspecified: Secondary | ICD-10-CM | POA: Insufficient documentation

## 2017-01-31 DIAGNOSIS — S199XXA Unspecified injury of neck, initial encounter: Secondary | ICD-10-CM | POA: Diagnosis not present

## 2017-01-31 DIAGNOSIS — M542 Cervicalgia: Secondary | ICD-10-CM | POA: Diagnosis not present

## 2017-01-31 DIAGNOSIS — S46012A Strain of muscle(s) and tendon(s) of the rotator cuff of left shoulder, initial encounter: Secondary | ICD-10-CM | POA: Diagnosis not present

## 2017-01-31 MED ORDER — OXYCODONE-ACETAMINOPHEN 5-325 MG PO TABS
ORAL_TABLET | ORAL | Status: AC
  Filled 2017-01-31: qty 1

## 2017-01-31 MED ORDER — MELOXICAM 15 MG PO TABS: 15.0000 mg | ORAL_TABLET | Freq: Every day | ORAL | 0 refills | Status: DC

## 2017-01-31 MED ORDER — DIAZEPAM 5 MG PO TABS: 5.0000 mg | ORAL_TABLET | Freq: Three times a day (TID) | ORAL | 0 refills | Status: DC | PRN

## 2017-01-31 MED ORDER — OXYCODONE-ACETAMINOPHEN 5-325 MG PO TABS
1.0000 | ORAL_TABLET | ORAL | Status: DC | PRN
  Administered 2017-01-31: 1 via ORAL

## 2017-01-31 MED ORDER — DIAZEPAM 5 MG PO TABS
5.0000 mg | ORAL_TABLET | Freq: Once | ORAL | Status: AC
  Administered 2017-01-31: 5 mg via ORAL
  Filled 2017-01-31: qty 1

## 2017-01-31 MED ORDER — KETOROLAC TROMETHAMINE 30 MG/ML IJ SOLN
30.0000 mg | Freq: Once | INTRAMUSCULAR | Status: AC
  Administered 2017-01-31: 30 mg via INTRAMUSCULAR
  Filled 2017-01-31: qty 1
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
## 2017-01-31 NOTE — Discharge Instructions (Signed)
Heating packs, rest, stretches. Take Mobic as prescribed for inflammation. Valium for muscle spasms. Follow-up as needed if not improving.

## 2017-05-13 ENCOUNTER — Encounter: Payer: Self-pay | Admitting: Internal Medicine

## 2017-05-13 ENCOUNTER — Ambulatory Visit: Payer: 59 | Admitting: Internal Medicine

## 2017-05-13 VITALS — BP 118/72 | HR 104 | Temp 98.5°F | Ht 67.0 in | Wt 119.0 lb

## 2017-05-13 DIAGNOSIS — R6889 Other general symptoms and signs: Secondary | ICD-10-CM | POA: Diagnosis not present

## 2017-05-13 DIAGNOSIS — F17200 Nicotine dependence, unspecified, uncomplicated: Secondary | ICD-10-CM

## 2017-05-13 DIAGNOSIS — G43009 Migraine without aura, not intractable, without status migrainosus: Secondary | ICD-10-CM | POA: Diagnosis not present

## 2017-05-13 DIAGNOSIS — Z87891 Personal history of nicotine dependence: Secondary | ICD-10-CM | POA: Insufficient documentation

## 2017-05-13 MED ORDER — VARENICLINE TARTRATE 0.5 MG X 11 & 1 MG X 42 PO MISC: ORAL | 0 refills | Status: DC

## 2017-05-13 MED ORDER — OSELTAMIVIR PHOSPHATE 75 MG PO CAPS: 75.0000 mg | ORAL_CAPSULE | Freq: Two times a day (BID) | ORAL | 0 refills | Status: DC

## 2017-05-13 MED ORDER — RIZATRIPTAN BENZOATE 10 MG PO TABS: ORAL_TABLET | ORAL | 5 refills | Status: DC

## 2017-05-13 MED ORDER — VARENICLINE TARTRATE 1 MG PO TABS: 1.0000 mg | ORAL_TABLET | Freq: Two times a day (BID) | ORAL | 1 refills | Status: DC

## 2017-05-13 NOTE — Progress Notes (Signed)
Subjective:    Patient ID: Kathy Mccoy, female    DOB: 08/06/1980, 37 y.o.   MRN: [id number]  HPI  Here to f/u with URI symptoms with fever, HA, diffuse myalgias, chest congestion with mild cough and ST for 2 days, after onset similar in 2 sons who saw peds yesterday with reported + rapid flu testing.  Pt denies chest pain, increased sob or doe, wheezing, orthopnea, PND, increased LE swelling, palpitations, dizziness or syncope.  Pt denies polydipsia, polyuria.  Also incidentally with increased migraine freq and severity over last few wks, asks for refill rizatriptan which has worked well in the past.  Also states she is wanting to quit smoking, now very motivated.  Denies worsening reflux, abd pain, dysphagia, n/v, bowel change or blood. Past Medical History:  Diagnosis Date  . Acute pharyngitis 03/23/2010  . ANXIETY DEPRESSION 01/02/2009  . CARDIAC ARRHYTHMIA 01/12/2009  . COMMON MIGRAINE 01/02/2009  . PREMATURE VENTRICULAR CONTRACTIONS 02/21/2009  . SINUSITIS- ACUTE-NOS 08/15/2009  . SYNCOPE 01/02/2009   Past Surgical History:  Procedure Laterality Date  . s/p rhinoplasty  2009    reports that she has quit smoking. she has never used smokeless tobacco. She reports that she drinks alcohol. Her drug history is not on file. family history is not on file. Allergies  Allergen Reactions  . Moxifloxacin Rash and Hives  . Sertraline Hcl Anxiety   Current Outpatient Medications on File Prior to Visit  Medication Sig Dispense Refill  . diazepam (VALIUM) 5 MG tablet Take 1 tablet (5 mg total) by mouth every 8 (eight) hours as needed for muscle spasms. 15 tablet 0  . ibuprofen (ADVIL,MOTRIN) 200 MG tablet Take by mouth.    . meloxicam (MOBIC) 15 MG tablet Take 1 tablet (15 mg total) by mouth daily. 20 tablet 0  . naproxen (NAPROSYN) 500 MG tablet Take 1 tablet (500 mg total) by mouth every 12 (twelve) hours as needed. 16 tablet 2   No current facility-administered medications on file prior to  visit.    Review of Systems  Constitutional: Negative for other unusual diaphoresis or sweats HENT: Negative for ear discharge or swelling Eyes: Negative for other worsening visual disturbances Respiratory: Negative for stridor or other swelling  Gastrointestinal: Negative for worsening distension or other blood Genitourinary: Negative for retention or other urinary change Musculoskeletal: Negative for other MSK pain or swelling Skin: Negative for color change or other new lesions Neurological: Negative for worsening tremors and other numbness  Psychiatric/Behavioral: Negative for worsening agitation or other fatigue All other system neg per pt    Objective:   Physical Exam BP 118/72   Pulse (!) 104   Temp 98.5 F (36.9 C) (Oral)   Ht 5\' 7"  (1.702 m)   Wt 119 lb (54 kg)   SpO2 99%   BMI 18.64 kg/m  VS noted, mild ill Constitutional: Pt appears in NAD HENT: Head: NCAT.  Right Ear: External ear normal.  Left Ear: External ear normal.  Bilat tm's with mild erythema.  Max sinus areas non tender.  Pharynx with mild erythema, no exudate Eyes: . Pupils are equal, round, and reactive to light. Conjunctivae and EOM are normal Nose: without d/c or deformity Neck: Neck supple. Gross normal ROM Cardiovascular: Normal rate and regular rhythm.   Pulmonary/Chest: Effort normal and breath sounds without rales or wheezing.  Neurological: Pt is alert. At baseline orientation, motor grossly intact Skin: Skin is warm. No rashes, other new lesions, no LE edema Psychiatric: Pt behavior  is normal without agitation  No other exam findings    Assessment & Plan:

## 2017-05-13 NOTE — Patient Instructions (Addendum)
Please take all new medication as prescribed - the tamiflu  Please take all new medication as prescribed - the Chantix (in hardcopy)  Please continue all other medications as before, and refills have been done if requested - the maxalt  Please have the pharmacy call with any other refills you may need.  Please continue your efforts at being more active, low cholesterol diet, and weight control.  You are otherwise up to date with prevention measures today.  Please keep your appointments with your specialists as you may have planned

## 2017-05-17 NOTE — Assessment & Plan Note (Signed)
Ok for triptan asd,  to f/u any worsening symptoms or concerns

## 2017-05-17 NOTE — Assessment & Plan Note (Signed)
With very recent onset of symptoms and exposure, will tx empirically with tamiflu asd,  to f/u any worsening symptoms or concerns

## 2017-05-17 NOTE — Assessment & Plan Note (Signed)
Ok for chantix asd,  to f/u any worsening symptoms or concerns 

## 2017-06-13 DIAGNOSIS — R69 Illness, unspecified: Secondary | ICD-10-CM | POA: Diagnosis not present

## 2017-06-16 DIAGNOSIS — J029 Acute pharyngitis, unspecified: Secondary | ICD-10-CM | POA: Diagnosis not present

## 2017-06-16 DIAGNOSIS — R69 Illness, unspecified: Secondary | ICD-10-CM | POA: Diagnosis not present

## 2017-07-22 DIAGNOSIS — R300 Dysuria: Secondary | ICD-10-CM | POA: Diagnosis not present

## 2017-07-22 DIAGNOSIS — N390 Urinary tract infection, site not specified: Secondary | ICD-10-CM | POA: Diagnosis not present

## 2017-08-31 DIAGNOSIS — N3001 Acute cystitis with hematuria: Secondary | ICD-10-CM | POA: Diagnosis not present

## 2017-09-29 DIAGNOSIS — N3001 Acute cystitis with hematuria: Secondary | ICD-10-CM | POA: Diagnosis not present

## 2017-09-29 DIAGNOSIS — L255 Unspecified contact dermatitis due to plants, except food: Secondary | ICD-10-CM | POA: Diagnosis not present

## 2017-11-04 DIAGNOSIS — J029 Acute pharyngitis, unspecified: Secondary | ICD-10-CM | POA: Diagnosis not present

## 2017-11-19 DIAGNOSIS — R82998 Other abnormal findings in urine: Secondary | ICD-10-CM | POA: Diagnosis not present

## 2017-11-19 DIAGNOSIS — R300 Dysuria: Secondary | ICD-10-CM | POA: Diagnosis not present

## 2017-11-26 DIAGNOSIS — Z30431 Encounter for routine checking of intrauterine contraceptive device: Secondary | ICD-10-CM | POA: Diagnosis not present

## 2017-11-26 DIAGNOSIS — Z124 Encounter for screening for malignant neoplasm of cervix: Secondary | ICD-10-CM | POA: Diagnosis not present

## 2018-01-25 ENCOUNTER — Emergency Department (HOSPITAL_COMMUNITY): Payer: 59

## 2018-01-25 ENCOUNTER — Emergency Department (HOSPITAL_COMMUNITY)
Admission: EM | Admit: 2018-01-25 | Discharge: 2018-01-25 | Disposition: A | Discharge status: Home / Self Care | Payer: 59 | Attending: Emergency Medicine | Admitting: Emergency Medicine

## 2018-01-25 ENCOUNTER — Encounter (HOSPITAL_COMMUNITY): Payer: Self-pay

## 2018-01-25 DIAGNOSIS — R52 Pain, unspecified: Secondary | ICD-10-CM | POA: Diagnosis not present

## 2018-01-25 DIAGNOSIS — S50812A Abrasion of left forearm, initial encounter: Secondary | ICD-10-CM | POA: Diagnosis not present

## 2018-01-25 DIAGNOSIS — Z87891 Personal history of nicotine dependence: Secondary | ICD-10-CM | POA: Insufficient documentation

## 2018-01-25 DIAGNOSIS — Z23 Encounter for immunization: Secondary | ICD-10-CM | POA: Insufficient documentation

## 2018-01-25 DIAGNOSIS — R000 Tachycardia, unspecified: Secondary | ICD-10-CM | POA: Diagnosis not present

## 2018-01-25 DIAGNOSIS — S5012XA Contusion of left forearm, initial encounter: Secondary | ICD-10-CM | POA: Diagnosis not present

## 2018-01-25 DIAGNOSIS — M79632 Pain in left forearm: Secondary | ICD-10-CM | POA: Diagnosis not present

## 2018-01-25 DIAGNOSIS — S59919A Unspecified injury of unspecified forearm, initial encounter: Secondary | ICD-10-CM | POA: Diagnosis not present

## 2018-01-25 MED ORDER — TETANUS-DIPHTH-ACELL PERTUSSIS 5-2.5-18.5 LF-MCG/0.5 IM SUSP
0.5000 mL | Freq: Once | INTRAMUSCULAR | Status: AC
  Administered 2018-01-25: 0.5 mL via INTRAMUSCULAR
  Filled 2018-01-25: qty 0.5

## 2018-01-25 NOTE — ED Notes (Signed)
Patient verbalizes understanding of discharge instructions. Opportunity for questioning and answers were provided. Ambulatory at discharge in NAD.  

## 2018-01-25 NOTE — ED Notes (Signed)
Patient transported to X-ray 
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
## 2018-01-25 NOTE — Discharge Instructions (Addendum)
Take Tylenol or Motrin for pain.  Follow-up with your family doctor if any problems 
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
## 2018-02-08 DIAGNOSIS — R300 Dysuria: Secondary | ICD-10-CM | POA: Diagnosis not present

## 2018-02-08 DIAGNOSIS — N390 Urinary tract infection, site not specified: Secondary | ICD-10-CM | POA: Diagnosis not present

## 2018-03-08 ENCOUNTER — Ambulatory Visit: Payer: 59 | Admitting: Family

## 2018-03-08 ENCOUNTER — Encounter: Payer: Self-pay | Admitting: Family

## 2018-03-08 ENCOUNTER — Ambulatory Visit: Payer: 59 | Admitting: Internal Medicine

## 2018-03-08 ENCOUNTER — Other Ambulatory Visit: Payer: 59

## 2018-03-08 VITALS — BP 110/78 | HR 92 | Temp 98.0°F | Ht 67.0 in | Wt 135.0 lb

## 2018-03-08 DIAGNOSIS — Z87440 Personal history of urinary (tract) infections: Secondary | ICD-10-CM | POA: Diagnosis not present

## 2018-03-08 DIAGNOSIS — R300 Dysuria: Secondary | ICD-10-CM

## 2018-03-08 LAB — POC URINALSYSI DIPSTICK (AUTOMATED)
BILIRUBIN UA: NEGATIVE
Glucose, UA: NEGATIVE
Ketones, UA: NEGATIVE
Leukocytes, UA: NEGATIVE
Nitrite, UA: NEGATIVE
Protein, UA: NEGATIVE
RBC UA: NEGATIVE
SPEC GRAV UA: 1.02 (ref 1.010–1.025)
Urobilinogen, UA: 0.2 E.U./dL
pH, UA: 6 (ref 5.0–8.0)

## 2018-03-08 MED ORDER — NITROFURANTOIN MONOHYD MACRO 100 MG PO CAPS: 100.0000 mg | ORAL_CAPSULE | Freq: Two times a day (BID) | ORAL | 0 refills | Status: DC

## 2018-03-08 NOTE — Progress Notes (Signed)
  Kathy Mccoy is a 37 y.o. female with the following history as recorded in [employer]:  Patient Active Problem List   Diagnosis Date Noted  . Flu-like symptoms 05/13/2017  . Smoker 05/13/2017  . Hx of syncope 05/10/2015  . POTS (postural orthostatic tachycardia syndrome) 05/10/2015  . Syncope 04/24/2015  . Preventative health care 11/07/2010  . PREMATURE VENTRICULAR CONTRACTIONS 02/21/2009  . CARDIAC ARRHYTHMIA 01/12/2009  . ANXIETY DEPRESSION 01/02/2009  . Migraine without aura 01/02/2009  . SYNCOPE 01/02/2009    Current Outpatient Medications  Medication Sig Dispense Refill  . levonorgestrel (MIRENA) 20 MCG/24HR IUD 1 each by Intrauterine route once.    . nitrofurantoin, macrocrystal-monohydrate, (MACROBID) 100 MG capsule Take 1 capsule (100 mg total) by mouth 2 (two) times daily. 14 capsule 0   No current facility-administered medications for this visit.     Allergies: Moxifloxacin  Past Medical History:  Diagnosis Date  . Acute pharyngitis 03/23/2010  . ANXIETY DEPRESSION 01/02/2009  . CARDIAC ARRHYTHMIA 01/12/2009  . COMMON MIGRAINE 01/02/2009  . PREMATURE VENTRICULAR CONTRACTIONS 02/21/2009  . SINUSITIS- ACUTE-NOS 08/15/2009  . SYNCOPE 01/02/2009    Past Surgical History:  Procedure Laterality Date  . s/p rhinoplasty  2009    History reviewed. No pertinent family history.  Social History   Tobacco Use  . Smoking status: Former Games developermoker  . Smokeless tobacco: Never Used  Substance Use Topics  . Alcohol use: Yes    Comment: rare    Subjective:  Patient presents with concerns for 2 day history of UTI; started some left over antibiotics on Sunday to treat symptoms; took one Macrobid on Sunday- does feels that it offered some benefit; no blood in urine; no fever;   In reviewing patient's notes, it looks like she has had 5-6 UTIs since March of this year; has become sexually active this past year- in monogamous relationship; admits not drinking enough water/ too much sweet  tea;     Objective:  Vitals:   03/08/18 0853  BP: 110/78  Pulse: 92  Temp: 98 F (36.7 C)  TempSrc: Oral  SpO2: 98%  Weight: 135 lb (61.2 kg)  Height: 5\' 7"  (1.702 m)    General: Well developed, well nourished, in no acute distress  Skin : Warm and dry.  Head: Normocephalic and atraumatic  Lungs: Respirations unlabored;  Neurologic: Alert and oriented; speech intact; face symmetrical; moves all extremities well; CNII-XII intact without focal deficit   Assessment:  1. Dysuria   2. History of recurrent UTIs     Plan:  1. Check U/A and urine culture today; will re-treat with Macrobid 100 mg bid x 7 days; Discussed with patient concerns about frequency of UTIs- suspect she could benefit from post-coital treatments; she will talk to her GYN first and then call back if needs to be referred to urology; encouraged increased water/ less sweet tea; try to void after intercourse; follow-up as needed otherwise.   No follow-ups on file.  Orders Placed This Encounter  Procedures  . Urine Culture    Standing Status:   Future    Standing Expiration Date:   03/08/2019  . POCT Urinalysis Dipstick (Automated)    Requested Prescriptions   Signed Prescriptions Disp Refills  . nitrofurantoin, macrocrystal-monohydrate, (MACROBID) 100 MG capsule 14 capsule 0    Sig: Take 1 capsule (100 mg total) by mouth 2 (two) times daily.

## 2018-03-10 LAB — URINE CULTURE
MICRO NUMBER:: 91385938
SPECIMEN QUALITY:: ADEQUATE

## 2018-04-10 DIAGNOSIS — B9689 Other specified bacterial agents as the cause of diseases classified elsewhere: Secondary | ICD-10-CM | POA: Diagnosis not present

## 2018-04-10 DIAGNOSIS — J019 Acute sinusitis, unspecified: Secondary | ICD-10-CM | POA: Diagnosis not present

## 2018-04-10 DIAGNOSIS — N3000 Acute cystitis without hematuria: Secondary | ICD-10-CM | POA: Diagnosis not present

## 2018-06-01 ENCOUNTER — Telehealth: Payer: Self-pay | Admitting: Internal Medicine

## 2018-06-01 NOTE — Telephone Encounter (Signed)
Medication was denied due to patient needing an appt with PCP. She has not been seen by PCP since January 2019. She had an acute visit with Vernona Rieger in November 2019.

## 2018-06-01 NOTE — Telephone Encounter (Signed)
Patient was prescribed Maxalt for headaches.  States this is working well.  It was discontinued at last OV with [facility] on 03/08/18.  I do not see anything in reference to this within the OV notes.  Patient is requesting refills to be sent to her pharmacy.  Please follow up with patient in regard.

## 2018-06-01 NOTE — Telephone Encounter (Signed)
Patient states she will call next week to schedule yearly visit.

## 2018-06-22 ENCOUNTER — Encounter: Payer: Self-pay | Admitting: Internal Medicine

## 2018-06-22 ENCOUNTER — Ambulatory Visit (INDEPENDENT_AMBULATORY_CARE_PROVIDER_SITE_OTHER): Payer: 59 | Admitting: Internal Medicine

## 2018-06-22 ENCOUNTER — Other Ambulatory Visit (INDEPENDENT_AMBULATORY_CARE_PROVIDER_SITE_OTHER): Payer: 59

## 2018-06-22 VITALS — BP 112/62 | HR 95 | Temp 97.9°F | Ht 67.0 in | Wt 138.0 lb

## 2018-06-22 DIAGNOSIS — Z114 Encounter for screening for human immunodeficiency virus [HIV]: Secondary | ICD-10-CM

## 2018-06-22 DIAGNOSIS — R739 Hyperglycemia, unspecified: Secondary | ICD-10-CM | POA: Insufficient documentation

## 2018-06-22 DIAGNOSIS — Z87891 Personal history of nicotine dependence: Secondary | ICD-10-CM

## 2018-06-22 DIAGNOSIS — Z0000 Encounter for general adult medical examination without abnormal findings: Secondary | ICD-10-CM | POA: Diagnosis not present

## 2018-06-22 HISTORY — DX: Hyperglycemia, unspecified: R73.9

## 2018-06-22 LAB — BASIC METABOLIC PANEL
BUN: 15 mg/dL (ref 6–23)
CO2: 26 mEq/L (ref 19–32)
Calcium: 9.5 mg/dL (ref 8.4–10.5)
Chloride: 103 mEq/L (ref 96–112)
Creatinine, Ser: 0.66 mg/dL (ref 0.40–1.20)
GFR: 101.25 mL/min (ref >60.00)
GLUCOSE: 88 mg/dL (ref 70–99)
Potassium: 3.7 mEq/L (ref 3.5–5.1)
Sodium: 136 mEq/L (ref 135–145)

## 2018-06-22 LAB — LIPID PANEL
Cholesterol: 147 mg/dL (ref 0–200)
HDL: 65.9 mg/dL (ref >39.00)
LDL Cholesterol: 64 mg/dL (ref 0–99)
NonHDL: 80.73
Total CHOL/HDL Ratio: 2
Triglycerides: 82 mg/dL (ref 0.0–149.0)
VLDL: 16.4 mg/dL (ref 0.0–40.0)

## 2018-06-22 LAB — CBC WITH DIFFERENTIAL/PLATELET
BASOS PCT: 0.6 % (ref 0.0–3.0)
Basophils Absolute: 0 10*3/uL (ref 0.0–0.1)
Eosinophils Absolute: 0 10*3/uL (ref 0.0–0.7)
Eosinophils Relative: 0.8 % (ref 0.0–5.0)
HCT: 41.4 % (ref 36.0–46.0)
Hemoglobin: 13.8 g/dL (ref 12.0–15.0)
Lymphocytes Relative: 28.4 % (ref 12.0–46.0)
Lymphs Abs: 1.7 10*3/uL (ref 0.7–4.0)
MCHC: 33.4 g/dL (ref 30.0–36.0)
MCV: 97.9 fl (ref 78.0–100.0)
Monocytes Absolute: 0.6 10*3/uL (ref 0.1–1.0)
Monocytes Relative: 9.7 % (ref 3.0–12.0)
Neutro Abs: 3.6 10*3/uL (ref 1.4–7.7)
Neutrophils Relative %: 60.5 % (ref 43.0–77.0)
Platelets: 205 10*3/uL (ref 150.0–400.0)
RBC: 4.23 Mil/uL (ref 3.87–5.11)
RDW: 12.8 % (ref 11.5–15.5)
WBC: 6 10*3/uL (ref 4.0–10.5)

## 2018-06-22 LAB — HEPATIC FUNCTION PANEL
ALT: 9 U/L (ref 0–35)
AST: 13 U/L (ref 0–37)
Albumin: 4.5 g/dL (ref 3.5–5.2)
Alkaline Phosphatase: 46 U/L (ref 39–117)
Bilirubin, Direct: 0.1 mg/dL (ref 0.0–0.3)
Total Bilirubin: 0.4 mg/dL (ref 0.2–1.2)
Total Protein: 6.6 g/dL (ref 6.0–8.3)

## 2018-06-22 LAB — URINALYSIS, ROUTINE W REFLEX MICROSCOPIC
Bilirubin Urine: NEGATIVE
KETONES UR: NEGATIVE
Leukocytes,Ua: NEGATIVE
NITRITE: NEGATIVE
Specific Gravity, Urine: 1.02 (ref 1.000–1.030)
Total Protein, Urine: NEGATIVE
Urine Glucose: NEGATIVE
Urobilinogen, UA: 0.2 (ref 0.0–1.0)
pH: 7 (ref 5.0–8.0)

## 2018-06-22 LAB — HEMOGLOBIN A1C: Hgb A1c MFr Bld: 5.2 % (ref 4.6–6.5)

## 2018-06-22 LAB — TSH: TSH: 1 u[IU]/mL (ref 0.35–4.50)

## 2018-06-22 MED ORDER — RIZATRIPTAN BENZOATE 10 MG PO TABS: 10.0000 mg | ORAL_TABLET | ORAL | 5 refills | Status: DC | PRN

## 2018-06-22 MED ORDER — BETAMETHASONE VALERATE 0.12 % EX FOAM: CUTANEOUS | 1 refills | Status: DC

## 2018-06-22 NOTE — Progress Notes (Signed)
Subjective:    Patient ID: Kathy Mccoy, female    DOB: 1981/02/09, 38 y.o.   MRN: [id number]  HPI  Here for wellness and f/u;  Overall doing ok;  Pt denies Chest pain, worsening SOB, DOE, wheezing, orthopnea, PND, worsening LE edema, palpitations, dizziness or syncope.  Pt denies neurological change such as new headache, facial or extremity weakness.  Pt denies polydipsia, polyuria, or low sugar symptoms. Pt states overall good compliance with treatment and medications, good tolerability, and has been trying to follow appropriate diet.  Pt denies worsening depressive symptoms, suicidal ideation or panic. No fever, night sweats, wt loss, loss of appetite, or other constitutional symptoms.  Pt states good ability with ADL's, has low fall risk, home safety reviewed and adequate, no other significant changes in hearing or vision, and only occasionally active with exercise.  Quit smoking feb 9 ,2019, with chantix x 3 wks., total was 1/2 ppd for 15 yrs. Joined the gym, gained 15 lbs but also going to the gym x 9 months Wt Readings from Last 3 Encounters:  06/22/18 138 lb (62.6 kg)  03/08/18 135 lb (61.2 kg)  05/13/17 119 lb (54 kg)  Has recurrent UTI now better with prn macrobid with intercourse per GYN.  No other complints Past Medical History:  Diagnosis Date  . Acute pharyngitis 03/23/2010  . ANXIETY DEPRESSION 01/02/2009  . CARDIAC ARRHYTHMIA 01/12/2009  . COMMON MIGRAINE 01/02/2009  . PREMATURE VENTRICULAR CONTRACTIONS 02/21/2009  . SINUSITIS- ACUTE-NOS 08/15/2009  . SYNCOPE 01/02/2009   Past Surgical History:  Procedure Laterality Date  . s/p rhinoplasty  2009    reports that she has quit smoking. She has never used smokeless tobacco. She reports current alcohol use. No history on file for drug. family history is not on file. Allergies  Allergen Reactions  . Moxifloxacin Rash and Hives   Current Outpatient Medications on File Prior to Visit  Medication Sig Dispense Refill  .  levonorgestrel (MIRENA) 20 MCG/24HR IUD 1 each by Intrauterine route once.     No current facility-administered medications on file prior to visit.    Review of Systems  Constitutional: Negative for other unusual diaphoresis or sweats HENT: Negative for ear discharge or swelling Eyes: Negative for other worsening visual disturbances Respiratory: Negative for stridor or other swelling  Gastrointestinal: Negative for worsening distension or other blood Genitourinary: Negative for retention or other urinary change Musculoskeletal: Negative for other MSK pain or swelling Skin: Negative for color change or other new lesions Neurological: Negative for worsening tremors and other numbness  Psychiatric/Behavioral: Negative for worsening agitation or other fatigue All other system neg per pt    Objective:   Physical Exam BP 112/62   Pulse 95   Temp 97.9 F (36.6 C) (Oral)   Ht 5\' 7"  (1.702 m)   Wt 138 lb (62.6 kg)   SpO2 96%   BMI 21.61 kg/m  VS noted,  Constitutional: Pt is oriented to person, place, and time. Appears well-developed and well-nourished, in no significant distress and comfortable Head: Normocephalic and atraumatic  Eyes: Conjunctivae and EOM are normal. Pupils are equal, round, and reactive to light Right Ear: External ear normal without discharge Left Ear: External ear normal without discharge Nose: Nose without discharge or deformity Mouth/Throat: Oropharynx is without other ulcerations and moist  Neck: Normal range of motion. Neck supple. No JVD present. No tracheal deviation present or significant neck LA or mass Cardiovascular: Normal rate, regular rhythm, normal heart sounds and intact distal pulses.  Pulmonary/Chest: WOB normal and breath sounds without rales or wheezing  Abdominal: Soft. Bowel sounds are normal. NT. No HSM  Musculoskeletal: Normal range of motion. Exhibits no edema Lymphadenopathy: Has no other cervical adenopathy.  Neurological: Pt is alert  and oriented to person, place, and time. Pt has normal reflexes. No cranial nerve deficit. Motor grossly intact, Gait intact Skin: Skin is warm and dry. No rash noted or new ulcerations Psychiatric:  Has normal mood and affect. Behavior is normal without agitation No other exam findings Lab Results  Component Value Date   WBC 6.0 06/22/2018   HGB 13.8 06/22/2018   HCT 41.4 06/22/2018   PLT 205.0 06/22/2018   GLUCOSE 88 06/22/2018   CHOL 147 06/22/2018   TRIG 82.0 06/22/2018   HDL 65.90 06/22/2018   LDLCALC 64 06/22/2018   ALT 9 06/22/2018   AST 13 06/22/2018   NA 136 06/22/2018   K 3.7 06/22/2018   CL 103 06/22/2018   CREATININE 0.66 06/22/2018   BUN 15 06/22/2018   CO2 26 06/22/2018   TSH 1.00 06/22/2018   HGBA1C 5.2 06/22/2018       Assessment & Plan:

## 2018-06-22 NOTE — Assessment & Plan Note (Signed)
Encouraged to cont to abstain 

## 2018-06-22 NOTE — Patient Instructions (Signed)
Please take all new medication as prescribed - the luxig for the seborrhea  Please continue all other medications as before, and refills have been done if requested - the rizatriptan  Please have the pharmacy call with any other refills you may need.  Please continue your efforts at being more active, low cholesterol diet, and weight control.  You are otherwise up to date with prevention measures today.  Please keep your appointments with your specialists as you may have planned  Please go to the LAB in the Basement (turn left off the elevator) for the tests to be done today  You will be contacted by phone if any changes need to be made immediately.  Otherwise, you will receive a letter about your results with an explanation, but please check with MyChart first.  Please remember to sign up for MyChart if you have not done so, as this will be important to you in the future with finding out test results, communicating by private email, and scheduling acute appointments online when needed.  Please return in 1 year for your yearly visit, or sooner if needed

## 2018-06-22 NOTE — Assessment & Plan Note (Signed)
For a1c with labs 

## 2018-06-22 NOTE — Assessment & Plan Note (Signed)

## 2019-04-07 NOTE — Progress Notes (Signed)
Patient referred by Biagio Borg, MD for pre-op cardiac evaluation  Subjective:   Kathy Mccoy, female    DOB: 1981-04-17, 38 y.o.   MRN: [id number]   Chief Complaint  Patient presents with  . New Patient (Initial Visit)  . surgical clearance    knee scope     HPI  38 y.o. Caucasian female referred for pre-op evaluation.  Patient is a runner, currently seeing Dr. Griffin Basil at [facility] for lateral meniscal tear.  She is going to undergo right knee arthroscopy and meniscectomy.  She is referred for cardiac evaluation given her prior history of syncope.  Patient has previously been seen by Dr. Caryl Comes, as well as Dr. Ronnald Ramp at [facility] with working diagnosis of dysautonomic syncope/POTS.  Echocardiogram in 2010 showed aneurysmal interatrial septum, mild LA dilatation, otherwise normal.  Patient has not had any syncopal episode in 3 years.  She has symptoms of lightheadedness that instantly resolved after sitting down, probably once every 2 weeks.  She exercises regularly without any symptoms of chest pain, shortness of breath.    Past Medical History:  Diagnosis Date  . Acute pharyngitis 03/23/2010  . ANXIETY DEPRESSION 01/02/2009  . CARDIAC ARRHYTHMIA 01/12/2009  . COMMON MIGRAINE 01/02/2009  . PREMATURE VENTRICULAR CONTRACTIONS 02/21/2009  . SINUSITIS- ACUTE-NOS 08/15/2009  . SYNCOPE 01/02/2009     Past Surgical History:  Procedure Laterality Date  . s/p rhinoplasty  2009     Social History   Tobacco Use  Smoking Status Former Smoker  Smokeless Tobacco Never Used    Social History   Substance and Sexual Activity  Alcohol Use Yes   Comment: rare     Family History  Problem Relation Age of Onset  . Hyperlipidemia Father   . Breast cancer Maternal Grandmother      Current Outpatient Medications on File Prior to Visit  Medication Sig Dispense Refill  . levonorgestrel (MIRENA) 20 MCG/24HR IUD 1 each by Intrauterine route once.    . meloxicam (MOBIC)  7.5 MG tablet Take 7.5 mg by mouth daily.    . rizatriptan (MAXALT) 10 MG tablet Take 1 tablet (10 mg total) by mouth as needed for migraine. May repeat in 2 hours if needed 10 tablet 5   No current facility-administered medications on file prior to visit.    Cardiovascular and other pertinent studies:  EKG 04/08/2019: Sinus rhythm 77 bpm. Normal EKG.  Echocardiogram 2010: EF 60-65% Mild LA dilatation Aneurysmal interatrial septum Mildly elevated PASP  Recent labs: 06/22/2018: Glucose 88, BUN/Cr 15/0.66. EGFR 101. Na/K 136/3.7. Rest of the CMP normal H/H 13/41. MCV 97. Platelets 205 HbA1C 5.2% Chol 147, TG 82, HDL 65, LDL 64 TSH normal    Review of Systems  Constitution: Negative for decreased appetite, malaise/fatigue, weight gain and weight loss.  HENT: Negative for congestion.   Eyes: Negative for visual disturbance.  Cardiovascular: Negative for chest pain, dyspnea on exertion, leg swelling, palpitations and syncope.  Respiratory: Negative for cough.   Endocrine: Negative for cold intolerance.  Hematologic/Lymphatic: Does not bruise/bleed easily.  Skin: Negative for itching and rash.  Musculoskeletal: Positive for joint pain. Negative for myalgias.  Gastrointestinal: Negative for abdominal pain, nausea and vomiting.  Genitourinary: Negative for dysuria.  Neurological: Positive for light-headedness. Negative for dizziness and weakness.  Psychiatric/Behavioral: The patient is not nervous/anxious.   All other systems reviewed and are negative.        Vitals:   04/08/19 1452  BP: 119/71  Pulse: 84  SpO2: 98%     Body mass index is 19.42 kg/m. Filed Weights   04/08/19 1452  Weight: 124 lb (56.2 kg)     Objective:   Physical Exam  Constitutional: She is oriented to person, place, and time. She appears well-developed and well-nourished. No distress.  HENT:  Head: Normocephalic and atraumatic.  Eyes: Pupils are equal, round, and reactive to light.  Conjunctivae are normal.  Neck: No JVD present.  Cardiovascular: Normal rate, regular rhythm and intact distal pulses.  No murmur heard. Pulmonary/Chest: Effort normal and breath sounds normal. She has no wheezes. She has no rales.  Abdominal: Soft. Bowel sounds are normal. There is no rebound.  Musculoskeletal:        General: No edema.  Lymphadenopathy:    She has no cervical adenopathy.  Neurological: She is alert and oriented to person, place, and time. No cranial nerve deficit.  Skin: Skin is warm and dry.  Psychiatric: She has a normal mood and affect.  Nursing note and vitals reviewed.       Assessment & Recommendations:   38 y.o. Caucasian female referred for pre-op evaluation.  Normal baseline EKG, physical exam. H/o dyasutomonic syncope, conservatively managed. No further cardiac workup is necessary. Low cardiac risk for knee surgery. On a separate note, recommend checking lipid panel with PCP at some pint for screening.    Thank you for referring the patient to [place]. Please feel free to contact with any questions.  Nigel Mormon, MD [facility] Pager: [phone] Office: [phone]

## 2019-04-08 ENCOUNTER — Other Ambulatory Visit: Payer: Self-pay

## 2019-04-08 ENCOUNTER — Encounter: Payer: Self-pay | Admitting: Cardiology

## 2019-04-08 ENCOUNTER — Ambulatory Visit (INDEPENDENT_AMBULATORY_CARE_PROVIDER_SITE_OTHER): Payer: 59 | Admitting: Cardiology

## 2019-04-08 VITALS — BP 119/71 | HR 84 | Ht 67.0 in | Wt 124.0 lb

## 2019-04-08 DIAGNOSIS — Z01810 Encounter for preprocedural cardiovascular examination: Secondary | ICD-10-CM

## 2019-08-12 ENCOUNTER — Telehealth: Payer: Self-pay | Admitting: Internal Medicine

## 2019-08-12 NOTE — Telephone Encounter (Signed)
Recv'd records from [facility] forwarded 2 pages to Dr. Oliver Barre 4/23/21fbg

## 2020-01-28 ENCOUNTER — Other Ambulatory Visit: Payer: Self-pay | Admitting: Internal Medicine

## 2020-02-03 ENCOUNTER — Ambulatory Visit: Payer: Commercial Managed Care - PPO | Admitting: Internal Medicine

## 2020-02-03 ENCOUNTER — Other Ambulatory Visit: Payer: Self-pay

## 2020-02-03 ENCOUNTER — Encounter: Payer: Self-pay | Admitting: Internal Medicine

## 2020-02-03 VITALS — BP 98/80 | HR 85 | Temp 98.3°F | Ht 67.0 in | Wt 128.0 lb

## 2020-02-03 DIAGNOSIS — L989 Disorder of the skin and subcutaneous tissue, unspecified: Secondary | ICD-10-CM

## 2020-02-03 DIAGNOSIS — L219 Seborrheic dermatitis, unspecified: Secondary | ICD-10-CM | POA: Diagnosis not present

## 2020-02-03 DIAGNOSIS — B373 Candidiasis of vulva and vagina: Secondary | ICD-10-CM | POA: Diagnosis not present

## 2020-02-03 DIAGNOSIS — Z0000 Encounter for general adult medical examination without abnormal findings: Secondary | ICD-10-CM | POA: Diagnosis not present

## 2020-02-03 DIAGNOSIS — B3731 Acute candidiasis of vulva and vagina: Secondary | ICD-10-CM

## 2020-02-03 MED ORDER — FLUCONAZOLE 150 MG PO TABS: ORAL_TABLET | ORAL | 1 refills | Status: DC

## 2020-02-03 MED ORDER — BETAMETHASONE VALERATE 0.12 % EX FOAM: CUTANEOUS | 1 refills | Status: DC

## 2020-02-03 NOTE — Progress Notes (Signed)
Subjective:    Patient ID: Kathy Mccoy, female    DOB: [birth year], 39 y.o.   MRN: [id number]  HPI  Here for wellness and f/u;  Overall doing ok;  Pt denies Chest pain, worsening SOB, DOE, wheezing, orthopnea, PND, worsening LE edema, palpitations, dizziness or syncope.  Pt denies neurological change such as new headache, facial or extremity weakness.  Pt denies polydipsia, polyuria, or low sugar symptoms. Pt states overall good compliance with treatment and medications, good tolerability, and has been trying to follow appropriate diet.  Pt denies worsening depressive symptoms, suicidal ideation or panic. No fever, night sweats, wt loss, loss of appetite, or other constitutional symptoms.  Pt states good ability with ADL's, has low fall risk, home safety reviewed and adequate, no other significant changes in hearing or vision, and only occasionally active with exercise. Working from home, does gym most days.  Needs refill of cream for seborrhea of scalp.  Also has incidental vaginal yeast infection mirena, tends to only get a few times per yr, sometime around the time she takes prophylactic macrobid after intercourse.  Also has several moles to torso maybe getting larger, asks for derm referral.  Working from home, does gym most days.   Past Medical History:  Diagnosis Date  . Acute pharyngitis 03/23/2010  . ANXIETY DEPRESSION 01/02/2009  . CARDIAC ARRHYTHMIA 01/12/2009  . COMMON MIGRAINE 01/02/2009  . POTS (postural orthostatic tachycardia syndrome)   . PREMATURE VENTRICULAR CONTRACTIONS 02/21/2009  . SINUSITIS- ACUTE-NOS 08/15/2009  . SYNCOPE 01/02/2009   Past Surgical History:  Procedure Laterality Date  . s/p rhinoplasty  2009    reports that she has quit smoking. She has never used smokeless tobacco. She reports current alcohol use. No history on file for drug use. family history includes Breast cancer in her maternal grandmother; Hyperlipidemia in her father. Allergies  Allergen Reactions    . Moxifloxacin Rash and Hives   Current Outpatient Medications on File Prior to Visit  Medication Sig Dispense Refill  . levonorgestrel (MIRENA) 20 MCG/24HR IUD 1 each by Intrauterine route once.    . meloxicam (MOBIC) 7.5 MG tablet Take 7.5 mg by mouth daily.     No current facility-administered medications on file prior to visit.   Review of Systems All otherwise neg per pt    Objective:   Physical Exam BP 98/80 (BP Location: Left Arm, Patient Position: Sitting, Cuff Size: Large)   Pulse 85   Temp 98.3 F (36.8 C) (Oral)   Ht 5\' 7"  (1.702 m)   Wt 128 lb (58.1 kg)   SpO2 98%   BMI 20.05 kg/m  VS noted,  Constitutional: Pt appears in NAD HENT: Head: NCAT.  Right Ear: External ear normal.  Left Ear: External ear normal.  Eyes: . Pupils are equal, round, and reactive to light. Conjunctivae and EOM are normal Nose: without d/c or deformity Neck: Neck supple. Gross normal ROM Cardiovascular: Normal rate and regular rhythm.   Pulmonary/Chest: Effort normal and breath sounds without rales or wheezing.  Abd:  Soft, NT, ND, + BS, no organomegaly Neurological: Pt is alert. At baseline orientation, motor grossly intact Skin: Skin is warm. No rashes, other new lesions, no LE edema Psychiatric: Pt behavior is normal without agitation  All otherwise neg per pt Lab Results  Component Value Date   WBC 6.0 06/22/2018   HGB 13.8 06/22/2018   HCT 41.4 06/22/2018   PLT 205.0 06/22/2018   GLUCOSE 88 06/22/2018   CHOL 147 06/22/2018  TRIG 82.0 06/22/2018   HDL 65.90 06/22/2018   LDLCALC 64 06/22/2018   ALT 9 06/22/2018   AST 13 06/22/2018   NA 136 06/22/2018   K 3.7 06/22/2018   CL 103 06/22/2018   CREATININE 0.66 06/22/2018   BUN 15 06/22/2018   CO2 26 06/22/2018   TSH 1.00 06/22/2018   HGBA1C 5.2 06/22/2018      Assessment & Plan:

## 2020-02-03 NOTE — Patient Instructions (Signed)
Please continue all other medications as before, and refills have been done if requested.  Please have the pharmacy call with any other refills you may need.  Please continue your efforts at being more active, low cholesterol diet, and weight control.  You are otherwise up to date with prevention measures today.  Please keep your appointments with your specialists as you may have planned  You will be contacted regarding the referral for: dermatology  We can hold on further lab testing today  Please make an Appointment to return for your 1 year visit, or sooner if needed

## 2020-02-05 DIAGNOSIS — B3731 Acute candidiasis of vulva and vagina: Secondary | ICD-10-CM | POA: Insufficient documentation

## 2020-02-05 DIAGNOSIS — L219 Seborrheic dermatitis, unspecified: Secondary | ICD-10-CM | POA: Insufficient documentation

## 2020-02-05 DIAGNOSIS — Z0000 Encounter for general adult medical examination without abnormal findings: Secondary | ICD-10-CM

## 2020-02-05 DIAGNOSIS — B373 Candidiasis of vulva and vagina: Secondary | ICD-10-CM | POA: Insufficient documentation

## 2020-02-05 DIAGNOSIS — L989 Disorder of the skin and subcutaneous tissue, unspecified: Secondary | ICD-10-CM

## 2020-02-05 HISTORY — DX: Acute candidiasis of vulva and vagina: B37.31

## 2020-02-05 HISTORY — DX: Encounter for general adult medical examination without abnormal findings: Z00.00

## 2020-02-05 HISTORY — DX: Disorder of the skin and subcutaneous tissue, unspecified: L98.9

## 2020-02-05 MED ORDER — RIZATRIPTAN BENZOATE 10 MG PO TABS
ORAL_TABLET | ORAL | 5 refills | Status: DC
Start: 2020-02-05 — End: 2021-01-25

## 2020-02-05 NOTE — Assessment & Plan Note (Signed)
Ok for cream refill

## 2020-02-05 NOTE — Assessment & Plan Note (Signed)
Ok for diflucan asd

## 2020-02-05 NOTE — Assessment & Plan Note (Signed)

## 2020-02-05 NOTE — Assessment & Plan Note (Signed)
Ok for derm referral 

## 2020-05-03 ENCOUNTER — Other Ambulatory Visit: Payer: Self-pay

## 2020-05-03 ENCOUNTER — Encounter: Payer: Self-pay | Admitting: Internal Medicine

## 2020-05-03 ENCOUNTER — Ambulatory Visit (INDEPENDENT_AMBULATORY_CARE_PROVIDER_SITE_OTHER): Payer: Self-pay | Admitting: Internal Medicine

## 2020-05-03 DIAGNOSIS — M5412 Radiculopathy, cervical region: Secondary | ICD-10-CM

## 2020-05-03 MED ORDER — HYDROCODONE-ACETAMINOPHEN 10-325 MG PO TABS: 1.0000 | ORAL_TABLET | Freq: Three times a day (TID) | ORAL | 0 refills | Status: AC | PRN

## 2020-05-03 MED ORDER — GABAPENTIN 300 MG PO CAPS: 300.0000 mg | ORAL_CAPSULE | Freq: Three times a day (TID) | ORAL | 1 refills | Status: DC

## 2020-05-03 NOTE — Progress Notes (Addendum)
Established Patient Office Visit  Subjective:  Patient ID: Kathy Mccoy, female    DOB: 1980/05/01  Age: 40 y.o. MRN: [id number]  CC:  Chief Complaint  Patient presents with  . Neck Pain    Neck pain and right shoulder pain (numbness radiating down into fingers)    HPI Kathy Mccoy presents for acute onset right neck pain severe, constant, burning assoc with tingling and loss of arm and grip strength since 2 wks ago when suddenly occurred at the gym with doing overhead arm presses.  Heard a pop with pain that just won quit.  Has tried multiple otc meds, mobic, even a massage x 2 and not icy hot patches but little help.  Nothing else seems to make better or worse  Past Medical History:  Diagnosis Date  . Acute pharyngitis 03/23/2010  . ANXIETY DEPRESSION 01/02/2009  . CARDIAC ARRHYTHMIA 01/12/2009  . COMMON MIGRAINE 01/02/2009  . POTS (postural orthostatic tachycardia syndrome)   . PREMATURE VENTRICULAR CONTRACTIONS 02/21/2009  . SINUSITIS- ACUTE-NOS 08/15/2009  . SYNCOPE 01/02/2009    Past Surgical History:  Procedure Laterality Date  . s/p rhinoplasty  2009    Family History  Problem Relation Age of Onset  . Hyperlipidemia Father   . Breast cancer Maternal Grandmother     Social History   Socioeconomic History  . Marital status: Single    Spouse name: Not on file  . Number of children: 2  . Years of education: Not on file  . Highest education level: Not on file  Occupational History  . Occupation: Writer: [employer]  Tobacco Use  . Smoking status: Former Games developer  . Smokeless tobacco: Never Used  Substance and Sexual Activity  . Alcohol use: Yes    Comment: rare  . Drug use: Not on file  . Sexual activity: Not on file  Other Topics Concern  . Not on file  Social History Narrative  . Not on file   Social Determinants of Health   Financial Resource Strain: Not on file  Food Insecurity: Not on file  Transportation Needs: Not on  file  Physical Activity: Not on file  Stress: Not on file  Social Connections: Not on file  Intimate Partner Violence: Not on file    Outpatient Medications Prior to Visit  Medication Sig Dispense Refill  . Betamethasone Valerate 0.12 % foam 1 application daily as needed 100 g 1  . levonorgestrel (MIRENA) 20 MCG/24HR IUD 1 each by Intrauterine route once.    . meloxicam (MOBIC) 7.5 MG tablet Take 7.5 mg by mouth daily.    . rizatriptan (MAXALT) 10 MG tablet TAKE 1 TABLET BY MOUTH AS NEEDED FOR MIGRAINE. MAY REPEAT IN 2 HOURS IF NEEDED 10 tablet 5  . fluconazole (DIFLUCAN) 150 MG tablet 1 tab by mouth every 3 days as needed 2 tablet 1   No facility-administered medications prior to visit.    Allergies  Allergen Reactions  . Moxifloxacin Rash and Hives    ROS Review of Systems - All otherwise neg per pt    Objective:    Physical Exam  BP 132/78 (BP Location: Left Arm, Patient Position: Sitting, Cuff Size: Normal)   Pulse 83   Temp 98.2 F (36.8 C) (Oral)   Ht 5\' 7"  (1.702 m)   Wt 132 lb 9.6 oz (60.1 kg)   SpO2 98%   BMI 20.77 kg/m  Wt Readings from Last 3 Encounters:  05/03/20 132 lb 9.6  oz (60.1 kg)  02/03/20 128 lb (58.1 kg)  04/08/19 124 lb (56.2 kg)   VS noted,  Constitutional: Pt appears in NAD HENT: Head: NCAT.  Right Ear: External ear normal.  Left Ear: External ear normal.  Eyes: . Pupils are equal, round, and reactive to light. Conjunctivae and EOM are normal Nose: without d/c or deformity Neck: Neck supple. Gross normal ROM Cardiovascular: Normal rate and regular rhythm.   Pulmonary/Chest: Effort normal and breath sounds without rales or wheezing.  Neurological: Pt is alert. At baseline orientation, motor grossly intact except for RUE diffuse 4/5 strength, decresed sens to LT to index and middle fingers Skin: Skin is warm. No rashes, other new lesions, no LE edema Psychiatric: Pt behavior is normal without agitation    Health Maintenance Due  Topic  Date Due  . COVID-19 Vaccine (1) Never done    There are no preventive care reminders to display for this patient.  Lab Results  Component Value Date   TSH 1.00 06/22/2018   Lab Results  Component Value Date   WBC 6.0 06/22/2018   HGB 13.8 06/22/2018   HCT 41.4 06/22/2018   MCV 97.9 06/22/2018   PLT 205.0 06/22/2018   Lab Results  Component Value Date   NA 136 06/22/2018   K 3.7 06/22/2018   CO2 26 06/22/2018   GLUCOSE 88 06/22/2018   BUN 15 06/22/2018   CREATININE 0.66 06/22/2018   BILITOT 0.4 06/22/2018   ALKPHOS 46 06/22/2018   AST 13 06/22/2018   ALT 9 06/22/2018   PROT 6.6 06/22/2018   ALBUMIN 4.5 06/22/2018   CALCIUM 9.5 06/22/2018   GFR 101.25 06/22/2018   Lab Results  Component Value Date   CHOL 147 06/22/2018   Lab Results  Component Value Date   HDL 65.90 06/22/2018   Lab Results  Component Value Date   LDLCALC 64 06/22/2018   Lab Results  Component Value Date   TRIG 82.0 06/22/2018   Lab Results  Component Value Date   CHOLHDL 2 06/22/2018   Lab Results  Component Value Date   HGBA1C 5.2 06/22/2018      Assessment & Plan:   Problem List Items Addressed This Visit      High   Right cervical radiculopathy    With severe constant pain and high suspicion for herniated cervical disc with nerve compression and RUE neuro change; now for hydrocodone 10 325 q6 prn, gabapentin 300 tid prn, and pt states already has appt with [facility] on tues jan 18; declines mri for now due to cost and no health insurance, but incdidently has insurance to start in 2 wks      Relevant Medications   gabapentin (NEURONTIN) 300 MG capsule      Meds ordered this encounter  Medications  . HYDROcodone-acetaminophen (NORCO) 10-325 MG tablet    Sig: Take 1 tablet by mouth every 8 (eight) hours as needed for up to 5 days.    Dispense:  30 tablet    Refill:  0  . gabapentin (NEURONTIN) 300 MG capsule    Sig: Take 1 capsule (300 mg total) by mouth 3  (three) times daily.    Dispense:  90 capsule    Refill:  1    Follow-up: Return if symptoms worsen or fail to improve.    Oliver Barre, MD

## 2020-05-03 NOTE — Patient Instructions (Signed)
Please take all new medication as prescribed - the hydrocodone, and gabapentin prescriptions  You can take 600 mg of the gabapentin during the day, and even 900 mg at bedtime as long as you are not overly sedated  Please continue all other medications as before, and refills have been done if requested.  Please have the pharmacy call with any other refills you may need.  Please keep your appointments with your specialists as you may have planned - Tuesday for Orthopedic

## 2020-05-03 NOTE — Assessment & Plan Note (Signed)
With severe constant pain and high suspicion for herniated cervical disc with nerve compression and RUE neuro change; now for hydrocodone 10 325 q6 prn, gabapentin 300 tid prn, and pt states already has appt [facility] on tues jan 18; declines mri for now due to cost and no health insurance, but incdidently has insurance to start in 2 wks

## 2020-05-10 ENCOUNTER — Telehealth: Payer: Self-pay | Admitting: Internal Medicine

## 2020-05-10 NOTE — Telephone Encounter (Signed)
Patient was seen 01.14.22 and didn't have her insurance card so she paid the 80 dollar copay. She called to give her insurance with [facility] but when I tried to add it, it wouldn't verify. She states her member id is [id number]. Patient trying to get her insurance added and get a refund back for the 80 dollars. [phone]

## 2020-06-05 DIAGNOSIS — M5022 Other cervical disc displacement, mid-cervical region, unspecified level: Secondary | ICD-10-CM | POA: Diagnosis not present

## 2020-06-05 DIAGNOSIS — M5412 Radiculopathy, cervical region: Secondary | ICD-10-CM | POA: Diagnosis not present

## 2020-06-07 DIAGNOSIS — L918 Other hypertrophic disorders of the skin: Secondary | ICD-10-CM | POA: Diagnosis not present

## 2020-07-14 DIAGNOSIS — J028 Acute pharyngitis due to other specified organisms: Secondary | ICD-10-CM | POA: Diagnosis not present

## 2020-07-14 DIAGNOSIS — J029 Acute pharyngitis, unspecified: Secondary | ICD-10-CM | POA: Diagnosis not present

## 2020-07-14 DIAGNOSIS — B9789 Other viral agents as the cause of diseases classified elsewhere: Secondary | ICD-10-CM | POA: Diagnosis not present

## 2020-09-25 DIAGNOSIS — M542 Cervicalgia: Secondary | ICD-10-CM | POA: Diagnosis not present

## 2020-09-25 DIAGNOSIS — M5022 Other cervical disc displacement, mid-cervical region, unspecified level: Secondary | ICD-10-CM | POA: Diagnosis not present

## 2020-09-25 DIAGNOSIS — M47812 Spondylosis without myelopathy or radiculopathy, cervical region: Secondary | ICD-10-CM | POA: Diagnosis not present

## 2020-10-10 DIAGNOSIS — M47812 Spondylosis without myelopathy or radiculopathy, cervical region: Secondary | ICD-10-CM | POA: Diagnosis not present

## 2021-01-25 ENCOUNTER — Other Ambulatory Visit: Payer: Self-pay | Admitting: Internal Medicine

## 2021-02-05 DIAGNOSIS — M25862 Other specified joint disorders, left knee: Secondary | ICD-10-CM | POA: Diagnosis not present

## 2021-02-05 DIAGNOSIS — M222X1 Patellofemoral disorders, right knee: Secondary | ICD-10-CM | POA: Diagnosis not present

## 2021-03-08 DIAGNOSIS — M222X1 Patellofemoral disorders, right knee: Secondary | ICD-10-CM | POA: Diagnosis not present

## 2021-03-13 ENCOUNTER — Other Ambulatory Visit: Payer: Self-pay | Admitting: Orthopaedic Surgery

## 2021-03-13 DIAGNOSIS — L723 Sebaceous cyst: Secondary | ICD-10-CM | POA: Diagnosis not present

## 2021-03-13 DIAGNOSIS — D2122 Benign neoplasm of connective and other soft tissue of left lower limb, including hip: Secondary | ICD-10-CM | POA: Diagnosis not present

## 2021-03-13 DIAGNOSIS — M7989 Other specified soft tissue disorders: Secondary | ICD-10-CM | POA: Diagnosis not present

## 2021-03-22 DIAGNOSIS — L723 Sebaceous cyst: Secondary | ICD-10-CM | POA: Diagnosis not present

## 2021-08-19 DIAGNOSIS — L720 Epidermal cyst: Secondary | ICD-10-CM | POA: Diagnosis not present

## 2021-08-19 DIAGNOSIS — B360 Pityriasis versicolor: Secondary | ICD-10-CM | POA: Diagnosis not present

## 2021-08-24 DIAGNOSIS — M545 Low back pain, unspecified: Secondary | ICD-10-CM | POA: Diagnosis not present

## 2021-09-24 ENCOUNTER — Other Ambulatory Visit: Payer: Self-pay | Admitting: Surgery

## 2021-09-24 DIAGNOSIS — R2231 Localized swelling, mass and lump, right upper limb: Secondary | ICD-10-CM | POA: Diagnosis not present

## 2021-10-04 ENCOUNTER — Other Ambulatory Visit: Payer: Self-pay | Admitting: Surgery

## 2021-10-04 DIAGNOSIS — R2231 Localized swelling, mass and lump, right upper limb: Secondary | ICD-10-CM

## 2021-10-10 DIAGNOSIS — M25561 Pain in right knee: Secondary | ICD-10-CM | POA: Diagnosis not present

## 2021-10-15 ENCOUNTER — Ambulatory Visit
Admission: RE | Admit: 2021-10-15 | Discharge: 2021-10-15 | Disposition: A | Discharge status: Home / Self Care | Payer: BC Managed Care – PPO | Source: Ambulatory Visit | Attending: Surgery | Admitting: Surgery

## 2021-10-15 ENCOUNTER — Ambulatory Visit
Admission: RE | Admit: 2021-10-15 | Discharge: 2021-10-15 | Disposition: A | Discharge status: Home / Self Care | Payer: Self-pay | Source: Ambulatory Visit | Attending: Surgery | Admitting: Surgery

## 2021-10-15 DIAGNOSIS — R2231 Localized swelling, mass and lump, right upper limb: Secondary | ICD-10-CM

## 2021-10-15 DIAGNOSIS — N6489 Other specified disorders of breast: Secondary | ICD-10-CM | POA: Diagnosis not present

## 2021-10-15 DIAGNOSIS — R922 Inconclusive mammogram: Secondary | ICD-10-CM | POA: Diagnosis not present

## 2021-11-20 DIAGNOSIS — Z13220 Encounter for screening for lipoid disorders: Secondary | ICD-10-CM | POA: Diagnosis not present

## 2021-11-20 DIAGNOSIS — Z01419 Encounter for gynecological examination (general) (routine) without abnormal findings: Secondary | ICD-10-CM | POA: Diagnosis not present

## 2021-11-20 DIAGNOSIS — Z1329 Encounter for screening for other suspected endocrine disorder: Secondary | ICD-10-CM | POA: Diagnosis not present

## 2021-11-20 DIAGNOSIS — Z113 Encounter for screening for infections with a predominantly sexual mode of transmission: Secondary | ICD-10-CM | POA: Diagnosis not present

## 2021-11-20 DIAGNOSIS — R61 Generalized hyperhidrosis: Secondary | ICD-10-CM | POA: Diagnosis not present

## 2021-12-04 DIAGNOSIS — Z30433 Encounter for removal and reinsertion of intrauterine contraceptive device: Secondary | ICD-10-CM | POA: Diagnosis not present

## 2021-12-06 ENCOUNTER — Ambulatory Visit: Payer: BC Managed Care – PPO | Admitting: Internal Medicine

## 2021-12-06 VITALS — BP 126/74 | HR 72 | Temp 97.7°F | Ht 67.0 in | Wt 121.0 lb

## 2021-12-06 DIAGNOSIS — R634 Abnormal weight loss: Secondary | ICD-10-CM | POA: Diagnosis not present

## 2021-12-06 DIAGNOSIS — Z0000 Encounter for general adult medical examination without abnormal findings: Secondary | ICD-10-CM | POA: Diagnosis not present

## 2021-12-06 MED ORDER — RIZATRIPTAN BENZOATE 10 MG PO TABS: ORAL_TABLET | ORAL | 11 refills | Status: DC

## 2021-12-06 MED ORDER — BETAMETHASONE VALERATE 0.12 % EX FOAM: CUTANEOUS | 2 refills | Status: DC

## 2021-12-06 MED ORDER — PAROXETINE HCL 10 MG PO TABS: 10.0000 mg | ORAL_TABLET | Freq: Every day | ORAL | 3 refills | Status: DC

## 2021-12-06 NOTE — Progress Notes (Unsigned)
Patient ID: Kathy Mccoy, female   DOB: [birth year], 41 y.o.   MRN: [id number]

## 2021-12-06 NOTE — Progress Notes (Unsigned)
Patient ID: Kathy Mccoy, female   DOB: 09/21/1980, 41 y.o.   MRN: [id number]         Chief Complaint:: wellness exam and Weight concerns  ***       HPI:  Kathy Mccoy is a 41 y.o. female here for wellness exam                        Also***   Wt Readings from Last 3 Encounters:  12/06/21 121 lb (54.9 kg)  05/03/20 132 lb 9.6 oz (60.1 kg)  02/03/20 128 lb (58.1 kg)   BP Readings from Last 3 Encounters:  12/06/21 126/74  05/03/20 132/78  02/03/20 98/80   Immunization History  Administered Date(s) Administered   Tdap 01/25/2018   Health Maintenance Due  Topic Date Due   INFLUENZA VACCINE  11/19/2021      Past Medical History:  Diagnosis Date   Acute pharyngitis 03/23/2010   ANXIETY DEPRESSION 01/02/2009   CARDIAC ARRHYTHMIA 01/12/2009   COMMON MIGRAINE 01/02/2009   POTS (postural orthostatic tachycardia syndrome)    PREMATURE VENTRICULAR CONTRACTIONS 02/21/2009   SINUSITIS- ACUTE-NOS 08/15/2009   SYNCOPE 01/02/2009   Past Surgical History:  Procedure Laterality Date   s/p rhinoplasty  2009    reports that she has quit smoking. She has never used smokeless tobacco. She reports current alcohol use. No history on file for drug use. family history includes Breast cancer in her maternal grandmother; Hyperlipidemia in her father. Allergies  Allergen Reactions   Moxifloxacin Rash and Hives   Current Outpatient Medications on File Prior to Visit  Medication Sig Dispense Refill   Betamethasone Valerate 0.12 % foam 1 application daily as needed 100 g 1   gabapentin (NEURONTIN) 300 MG capsule Take 1 capsule (300 mg total) by mouth 3 (three) times daily. 90 capsule 1   levonorgestrel (MIRENA) 20 MCG/24HR IUD 1 each by Intrauterine route once.     meloxicam (MOBIC) 7.5 MG tablet Take 7.5 mg by mouth daily.     rizatriptan (MAXALT) 10 MG tablet TAKE 1 TABLET BY MOUTH AS NEEDED FOR MIGRAINE. MAY REPEAT IN 2 HOURS IF NEEDED 10 tablet 5   No current facility-administered medications  on file prior to visit.        ROS:  All others reviewed and negative.  Objective        PE:  BP 126/74 (BP Location: Right Arm, Patient Position: Sitting, Cuff Size: Large)   Pulse 72   Temp 97.7 F (36.5 C) (Oral)   Ht 5\' 7"  (1.702 m)   Wt 121 lb (54.9 kg)   SpO2 99%   BMI 18.95 kg/m                 Constitutional: Pt appears in NAD               HENT: Head: NCAT.                Right Ear: External ear normal.                 Left Ear: External ear normal.                Eyes: . Pupils are equal, round, and reactive to light. Conjunctivae and EOM are normal               Nose: without d/c or deformity  Neck: Neck supple. Gross normal ROM               Cardiovascular: Normal rate and regular rhythm.                 Pulmonary/Chest: Effort normal and breath sounds without rales or wheezing.                Abd:  Soft, NT, ND, + BS, no organomegaly               Neurological: Pt is alert. At baseline orientation, motor grossly intact               Skin: Skin is warm. No rashes, no other new lesions, LE edema - ***               Psychiatric: Pt behavior is normal without agitation   Micro: none  Cardiac tracings I have personally interpreted today:  none  Pertinent Radiological findings (summarize): none   Lab Results  Component Value Date   WBC 6.0 06/22/2018   HGB 13.8 06/22/2018   HCT 41.4 06/22/2018   PLT 205.0 06/22/2018   GLUCOSE 88 06/22/2018   CHOL 147 06/22/2018   TRIG 82.0 06/22/2018   HDL 65.90 06/22/2018   LDLCALC 64 06/22/2018   ALT 9 06/22/2018   AST 13 06/22/2018   NA 136 06/22/2018   K 3.7 06/22/2018   CL 103 06/22/2018   CREATININE 0.66 06/22/2018   BUN 15 06/22/2018   CO2 26 06/22/2018   TSH 1.00 06/22/2018   HGBA1C 5.2 06/22/2018   Assessment/Plan:  Kathy Mccoy is a 41 y.o. White or Caucasian [1] female with  has a past medical history of Acute pharyngitis (03/23/2010), ANXIETY DEPRESSION (01/02/2009), CARDIAC ARRHYTHMIA  (01/12/2009), COMMON MIGRAINE (01/02/2009), POTS (postural orthostatic tachycardia syndrome), PREMATURE VENTRICULAR CONTRACTIONS (02/21/2009), SINUSITIS- ACUTE-NOS (08/15/2009), and SYNCOPE (01/02/2009).  No problem-specific Assessment & Plan notes found for this encounter.  Followup: No follow-ups on file.  Oliver Barre, MD 12/06/2021 4:17 PM [facility]

## 2021-12-06 NOTE — Patient Instructions (Signed)
Please take all new medication as prescribed - the paxil 10 mg per day  Please continue all other medications as before, and refills have been done if requested.  Please have the pharmacy call with any other refills you may need.  Please keep your appointments with your specialists as you may have planned  Please make an Appointment to return for your 1 year visit, or sooner if needed

## 2021-12-07 ENCOUNTER — Encounter: Payer: Self-pay | Admitting: Internal Medicine

## 2021-12-07 DIAGNOSIS — R634 Abnormal weight loss: Secondary | ICD-10-CM | POA: Insufficient documentation

## 2021-12-07 HISTORY — DX: Abnormal weight loss: R63.4

## 2021-12-07 NOTE — Assessment & Plan Note (Signed)
Age and sex appropriate education and counseling updated with regular exercise and diet Referrals for preventative services - decliens hep c screen Immunizations addressed - declines covid booster Smoking counseling  - none needed Evidence for depression or other mood disorder - chronic anxiety no change Most recent labs reviewed. I have personally reviewed and have noted: 1) the patient's medical and social history 2) The patient's current medications and supplements 3) The patient's height, weight, and BMI have been recorded in the chart

## 2021-12-07 NOTE — Assessment & Plan Note (Signed)
dw pt, limited options d/w pt, given anxiety ok for paxil 10 mg qd

## 2021-12-09 ENCOUNTER — Other Ambulatory Visit: Payer: Self-pay

## 2021-12-09 ENCOUNTER — Telehealth: Payer: Self-pay | Admitting: Internal Medicine

## 2021-12-09 MED ORDER — BETAMETHASONE VALERATE 0.12 % EX FOAM: CUTANEOUS | 2 refills | Status: DC

## 2021-12-09 MED ORDER — FLUOCINONIDE 0.05 % EX CREA: 1.0000 | TOPICAL_CREAM | Freq: Two times a day (BID) | CUTANEOUS | 1 refills | Status: DC

## 2021-12-09 NOTE — Telephone Encounter (Signed)
Paxil and maxalt are commonly taken together, so I would advise the risk is quite low  Also ok for lidex .05 % cream - done erx

## 2021-12-09 NOTE — Telephone Encounter (Signed)
Pt said she is prescribed PARoxetine (PAXIL) 10 MG tablet and rizatriptan (MAXALT) 10 MG tablet. Pt states she was looking online and google advised her that she should not be taking these two medications together because it could cause a serious reaction. Pt states she only takes Rizatriptan when she has a migraine. She is wanting to know if she has a migraine and takes rizatriptan, should she skip a dose of paxil? Pt would like a call with advice on how she should take the medications and avoid a reaction.     Pt is also requesting an alternative for . She said the pharmacy advised her that her insurance does not cover the rx. They advised her that her insurance will cover Fluocinonide (Lidex) .5% solution. Pt would like that rx if Dr. Jonny Ruiz is ok prescribing it.    Please advise

## 2021-12-09 NOTE — Telephone Encounter (Signed)
Please advise on possible reactions if taking both meds together.

## 2021-12-10 NOTE — Telephone Encounter (Signed)
Patient message sent °

## 2021-12-19 NOTE — Telephone Encounter (Signed)
Patient prefers foam instead of the cream

## 2021-12-26 MED ORDER — FLUOCINONIDE 0.05 % EX SOLN: 1.0000 | Freq: Two times a day (BID) | CUTANEOUS | 5 refills | Status: DC

## 2021-12-26 NOTE — Addendum Note (Signed)
Addended by: Corwin Levins on: 12/26/2021 01:10 PM   Modules accepted: Orders

## 2021-12-26 NOTE — Telephone Encounter (Signed)
Pt is requesting fluocinonide 0.5% soln.

## 2022-02-14 DIAGNOSIS — M25561 Pain in right knee: Secondary | ICD-10-CM | POA: Diagnosis not present

## 2022-03-17 ENCOUNTER — Telehealth: Payer: Self-pay | Admitting: Internal Medicine

## 2022-03-17 NOTE — Telephone Encounter (Signed)
Pt has questions about medication PAXIL.  She takes it for weight gain and other issues. Pt wonders if something else is better or what does provider think. She tried a friend's medication and not sure that is a problem.  Call [phone] or MyChart message per pt request, either is fine.

## 2022-03-28 DIAGNOSIS — M25561 Pain in right knee: Secondary | ICD-10-CM | POA: Diagnosis not present

## 2022-04-01 DIAGNOSIS — M25561 Pain in right knee: Secondary | ICD-10-CM | POA: Diagnosis not present

## 2022-04-04 ENCOUNTER — Ambulatory Visit (INDEPENDENT_AMBULATORY_CARE_PROVIDER_SITE_OTHER): Payer: BC Managed Care – PPO | Admitting: Internal Medicine

## 2022-04-04 VITALS — BP 126/74 | HR 85 | Temp 99.2°F | Ht 67.0 in | Wt 117.0 lb

## 2022-04-04 DIAGNOSIS — R739 Hyperglycemia, unspecified: Secondary | ICD-10-CM

## 2022-04-04 DIAGNOSIS — F341 Dysthymic disorder: Secondary | ICD-10-CM

## 2022-04-04 DIAGNOSIS — R634 Abnormal weight loss: Secondary | ICD-10-CM

## 2022-04-04 MED ORDER — DIAZEPAM 5 MG PO TABS: 5.0000 mg | ORAL_TABLET | Freq: Two times a day (BID) | ORAL | 2 refills | Status: DC | PRN

## 2022-04-04 NOTE — Progress Notes (Signed)
Patient ID: Kathy Mccoy, female   DOB: 1980-10-28, 41 y.o.   MRN: [id number]        Chief Complaint: follow up anxiety disorder, hyperglycemia, wt loss       HPI:  Kathy Mccoy is a 41 y.o. female here with persistent anxiety and low appetite and cannt gain wt, in fact continues to lose, recent paxil not helping, Denies worsening depressive symptoms, suicidal ideation, or panic; Pt recalls a time when she took 5 mg pre MRI, and felt calm and appetitie returned for the rest of the day, ate like a maniac.  Pt denies chest pain, increased sob or doe, wheezing, orthopnea, PND, increased LE swelling, palpitations, dizziness or syncope.   Pt denies polydipsia, polyuria, or new focal neuro s/s.          Wt Readings from Last 3 Encounters:  04/04/22 117 lb (53.1 kg)  12/06/21 121 lb (54.9 kg)  05/03/20 132 lb 9.6 oz (60.1 kg)   BP Readings from Last 3 Encounters:  04/04/22 126/74  12/06/21 126/74  05/03/20 132/78         Past Medical History:  Diagnosis Date   Acute pharyngitis 03/23/2010   ANXIETY DEPRESSION 01/02/2009   CARDIAC ARRHYTHMIA 01/12/2009   COMMON MIGRAINE 01/02/2009   POTS (postural orthostatic tachycardia syndrome)    PREMATURE VENTRICULAR CONTRACTIONS 02/21/2009   SINUSITIS- ACUTE-NOS 08/15/2009   SYNCOPE 01/02/2009   Past Surgical History:  Procedure Laterality Date   s/p rhinoplasty  2009    reports that she has quit smoking. She has never used smokeless tobacco. She reports current alcohol use. No history on file for drug use. family history includes Breast cancer in her maternal grandmother; Hyperlipidemia in her father. Allergies  Allergen Reactions   Moxifloxacin Rash and Hives   Current Outpatient Medications on File Prior to Visit  Medication Sig Dispense Refill   tiZANidine (ZANAFLEX) 4 MG tablet Take 2 mg by mouth 3 (three) times daily as needed.     Betamethasone Valerate 0.12 % foam 1 application daily as needed 100 g 2   fluocinonide (LIDEX) 0.05 % external  solution Apply 1 Application topically 2 (two) times daily. 60 mL 5   gabapentin (NEURONTIN) 300 MG capsule Take 1 capsule (300 mg total) by mouth 3 (three) times daily. 90 capsule 1   levonorgestrel (MIRENA) 20 MCG/24HR IUD 1 each by Intrauterine route once.     meloxicam (MOBIC) 7.5 MG tablet Take 7.5 mg by mouth daily.     rizatriptan (MAXALT) 10 MG tablet May repeat in 2 hours if needed 10 tablet 11   No current facility-administered medications on file prior to visit.        ROS:  All others reviewed and negative.  Objective        PE:  BP 126/74 (BP Location: Left Arm, Patient Position: Sitting, Cuff Size: Large)   Pulse 85   Temp 99.2 F (37.3 C) (Oral)   Ht 5\' 7"  (1.702 m)   Wt 117 lb (53.1 kg)   SpO2 94%   BMI 18.32 kg/m                 Constitutional: Pt appears in NAD               HENT: Head: NCAT.                Right Ear: External ear normal.  Left Ear: External ear normal.                Eyes: . Pupils are equal, round, and reactive to light. Conjunctivae and EOM are normal               Nose: without d/c or deformity               Neck: Neck supple. Gross normal ROM               Cardiovascular: Normal rate and regular rhythm.                 Pulmonary/Chest: Effort normal and breath sounds without rales or wheezing.                Abd:  Soft, NT, ND, + BS, no organomegaly               Neurological: Pt is alert. At baseline orientation, motor grossly intact               Skin: Skin is warm. No rashes, no other new lesions, LE edema - none               Psychiatric: Pt behavior is normal without agitation , marked nervous  Micro: none  Cardiac tracings I have personally interpreted today:  none  Pertinent Radiological findings (summarize): none   Lab Results  Component Value Date   WBC 6.0 06/22/2018   HGB 13.8 06/22/2018   HCT 41.4 06/22/2018   PLT 205.0 06/22/2018   GLUCOSE 88 06/22/2018   CHOL 147 06/22/2018   TRIG 82.0 06/22/2018    HDL 65.90 06/22/2018   LDLCALC 64 06/22/2018   ALT 9 06/22/2018   AST 13 06/22/2018   NA 136 06/22/2018   K 3.7 06/22/2018   CL 103 06/22/2018   CREATININE 0.66 06/22/2018   BUN 15 06/22/2018   CO2 26 06/22/2018   TSH 1.00 06/22/2018   HGBA1C 5.2 06/22/2018   Assessment/Plan:  Kathy Mccoy is a 41 y.o. White or Caucasian [1] female with  has a past medical history of Acute pharyngitis (03/23/2010), ANXIETY DEPRESSION (01/02/2009), CARDIAC ARRHYTHMIA (01/12/2009), COMMON MIGRAINE (01/02/2009), POTS (postural orthostatic tachycardia syndrome), PREMATURE VENTRICULAR CONTRACTIONS (02/21/2009), SINUSITIS- ACUTE-NOS (08/15/2009), and SYNCOPE (01/02/2009).  ANXIETY DEPRESSION No improved with paxil 10 mg per per after 2 mo sto stopped this, ok for valium 5 mg qd prn trial,  to f/u any worsening symptoms or concerns  Hyperglycemia Lab Results  Component Value Date   HGBA1C 5.2 06/22/2018   Stable, pt to continue current medical treatment  - diet, exercise, very unlikely to be cause of wt loss   Weight loss Hopefully to improve with tx as above, declines nutrition referral  Followup: Return in about 1 year (around 04/05/2023).  Oliver Barre, MD 04/06/2022 3:52 PM  [facility]

## 2022-04-04 NOTE — Patient Instructions (Signed)
Please take all new medication as prescribed - the valium as needed  Please continue all other medications as before, and refills have been done if requested.  Please have the pharmacy call with any other refills you may need.  Please continue your efforts at being more active, low cholesterol diet  Please keep your appointments with your specialists as you may have planned  Please make an Appointment to return for your 1 year visit, or sooner if needed

## 2022-04-06 ENCOUNTER — Encounter: Payer: Self-pay | Admitting: Internal Medicine

## 2022-04-06 NOTE — Assessment & Plan Note (Signed)
Lab Results  Component Value Date   HGBA1C 5.2 06/22/2018   Stable, pt to continue current medical treatment  - diet, exercise, very unlikely to be cause of wt loss

## 2022-04-06 NOTE — Assessment & Plan Note (Signed)
No improved with paxil 10 mg per per after 2 mo sto stopped this, ok for valium 5 mg qd prn trial,  to f/u any worsening symptoms or concerns

## 2022-04-06 NOTE — Assessment & Plan Note (Signed)
Hopefully to improve with tx as above, declines nutrition referral

## 2022-04-28 DIAGNOSIS — Z713 Dietary counseling and surveillance: Secondary | ICD-10-CM | POA: Diagnosis not present

## 2022-05-05 ENCOUNTER — Other Ambulatory Visit: Payer: Self-pay | Admitting: Internal Medicine

## 2022-05-06 NOTE — H&P (Signed)
PREOPERATIVE H&P  Chief Complaint: right knee subluxation,compartment syndrome,chndromalacia, cartilage tear  HPI: Kathy Mccoy is a 42 y.o. female who is scheduled for, Procedure(s): ARTHROSCOPY KNEE/diagnostic.   Patient has a past medical history significant for POTS.   The patient had a knee scope with patellofemoral chondroplasty 2  years ago. She has had occasional flares of arthritis since. She has had worsening pain over the past 6 months. She has an occasional catching and swelling. She got temporary relief from injections. She continues to have patella based pain, but her knee has given out on her multiple times and she fell twice.  She did not have any pop with that injury.  She is frustrated by her knee currently  Symptoms are rated as moderate to severe, and have been worsening.  This is significantly impairing activities of daily living.    Please see clinic note for further details on this patient's care.    She has elected for surgical management.   Past Medical History:  Diagnosis Date   Acute pharyngitis 03/23/2010   ANXIETY DEPRESSION 01/02/2009   CARDIAC ARRHYTHMIA 01/12/2009   COMMON MIGRAINE 01/02/2009   POTS (postural orthostatic tachycardia syndrome)    PREMATURE VENTRICULAR CONTRACTIONS 02/21/2009   SINUSITIS- ACUTE-NOS 08/15/2009   SYNCOPE 01/02/2009   Past Surgical History:  Procedure Laterality Date   s/p rhinoplasty  2009   Social History   Socioeconomic History   Marital status: Single    Spouse name: Not on file   Number of children: 2   Years of education: Not on file   Highest education level: Not on file  Occupational History   Occupation: brokerage admin support    Employer: [email or web address]  Tobacco Use   Smoking status: Former   Smokeless tobacco: Never  Substance and Sexual Activity   Alcohol use: Yes    Comment: rare   Drug use: Not on file   Sexual activity: Not on file  Other Topics Concern   Not on file  Social History  Narrative   Not on file   Social Determinants of Health   Financial Resource Strain: Not on file  Food Insecurity: Not on file  Transportation Needs: Not on file  Physical Activity: Not on file  Stress: Not on file  Social Connections: Not on file   Family History  Problem Relation Age of Onset   Hyperlipidemia Father    Breast cancer Maternal Grandmother    Allergies  Allergen Reactions   Moxifloxacin Rash and Hives   Prior to Admission medications   Medication Sig Start Date End Date Taking? Authorizing Provider  Betamethasone Valerate 5.00 % foam 1 application daily as needed 12/09/21   Biagio Borg, MD  diazepam (VALIUM) 5 MG tablet Take 1 tablet (5 mg total) by mouth every 12 (twelve) hours as needed for anxiety. 04/04/22   Biagio Borg, MD  fluocinonide (LIDEX) 0.05 % external solution Apply 1 Application topically 2 (two) times daily. 12/26/21   Biagio Borg, MD  gabapentin (NEURONTIN) 300 MG capsule Take 1 capsule (300 mg total) by mouth 3 (three) times daily. 05/03/20   Biagio Borg, MD  levonorgestrel (MIRENA) 20 MCG/24HR IUD 1 each by Intrauterine route once.    [provider]  meloxicam (MOBIC) 7.5 MG tablet Take 7.5 mg by mouth daily. 02/22/19   [provider]  rizatriptan (MAXALT) 10 MG tablet TAKE 1 TABLET BY MOUTH AS NEEDED FOR MIGRAINE. MAY REPEAT IN 2 HOURS IF NEEDED.  05/05/22   Biagio Borg, MD  tiZANidine (ZANAFLEX) 4 MG tablet Take 2 mg by mouth 3 (three) times daily as needed. 02/04/22   [provider]    ROS: All other systems have been reviewed and were otherwise negative with the exception of those mentioned in the HPI and as above.  Physical Exam: General: Alert, no acute distress Cardiovascular: No pedal edema Respiratory: No cyanosis, no use of accessory musculature GI: No organomegaly, abdomen is soft and non-tender Skin: No lesions in the area of chief complaint Neurologic: Sensation intact distally Psychiatric:  Patient is competent for consent with normal mood and affect Lymphatic: No axillary or cervical lymphadenopathy  MUSCULOSKELETAL:  Range of motion of the knee is full.  She has pain with testing her ACL.  She has positive patellar grind.  No obvious instability noted.    Imaging: MRI which demonstrates significant bony edema in the femur at the origin of the ACL and the insertion of the ACL on the tibia.  On sagittal views it appears that there are some fibers of ACL that are actually torn, but the majority of her ligament is intact.  She has a full thickness cartilage defect of about 8-10 millimeters in size at the tip at the apex of the patella.  TT-TG is relatively normal.  Assessment: right knee subluxation,compartment syndrome,chndromalacia, cartilage tear  Plan: Plan for Procedure(s): ARTHROSCOPY KNEE/diagnostic  We went through the options with the patient at length.  Unfortunately she seems quite debilitated by her cartilage lesion; however, its size appears relatively small.  I think it would be reasonable to consider cartilage restoration and we went over this case with Dr. Zachery Dakins, who does not think she is a candidate for arthroplasty yet.  The risks, benefits and alternatives of tibial tubercle osteotomy and a cartilage restoration procedure with DeNovo were discussed. Patient's insurance however declines denovo procedure. We will proceed with right knee arthroscopy with debridement and MACI biopsy. Staging procedure  The risks benefits and alternatives were discussed with the patient including but not limited to the risks of nonoperative treatment, versus surgical intervention including infection, bleeding, nerve injury,  blood clots, cardiopulmonary complications, morbidity, mortality, among others, and they were willing to proceed.   The patient acknowledged the explanation, agreed to proceed with the plan and consent was signed.   Operative Plan: as above Discharge  Medications: standard DVT Prophylaxis: aspirin Physical Therapy: outpatient PT. Special Discharge needs: +/-   Ethelda Chick, PA-C  05/06/2022 8:23 PM

## 2022-05-08 ENCOUNTER — Encounter (HOSPITAL_BASED_OUTPATIENT_CLINIC_OR_DEPARTMENT_OTHER): Payer: Self-pay | Admitting: Orthopaedic Surgery

## 2022-05-08 ENCOUNTER — Other Ambulatory Visit: Payer: Self-pay

## 2022-05-08 DIAGNOSIS — Z713 Dietary counseling and surveillance: Secondary | ICD-10-CM | POA: Diagnosis not present

## 2022-05-12 DIAGNOSIS — Z713 Dietary counseling and surveillance: Secondary | ICD-10-CM | POA: Diagnosis not present

## 2022-05-15 ENCOUNTER — Ambulatory Visit (HOSPITAL_BASED_OUTPATIENT_CLINIC_OR_DEPARTMENT_OTHER): Payer: BC Managed Care – PPO | Admitting: Certified Registered"

## 2022-05-15 ENCOUNTER — Ambulatory Visit (HOSPITAL_BASED_OUTPATIENT_CLINIC_OR_DEPARTMENT_OTHER)
Admission: RE | Admit: 2022-05-15 | Discharge: 2022-05-15 | Disposition: A | Discharge status: Home / Self Care | Payer: BC Managed Care – PPO | Attending: Orthopaedic Surgery | Admitting: Orthopaedic Surgery

## 2022-05-15 ENCOUNTER — Encounter (HOSPITAL_BASED_OUTPATIENT_CLINIC_OR_DEPARTMENT_OTHER): Payer: Self-pay | Admitting: Orthopaedic Surgery

## 2022-05-15 ENCOUNTER — Other Ambulatory Visit: Payer: Self-pay

## 2022-05-15 ENCOUNTER — Encounter (HOSPITAL_BASED_OUTPATIENT_CLINIC_OR_DEPARTMENT_OTHER)
Admission: RE | Disposition: A | Discharge status: Home / Self Care | Payer: Self-pay | Source: Home / Self Care | Attending: Orthopaedic Surgery

## 2022-05-15 DIAGNOSIS — M2241 Chondromalacia patellae, right knee: Secondary | ICD-10-CM | POA: Insufficient documentation

## 2022-05-15 DIAGNOSIS — Z87891 Personal history of nicotine dependence: Secondary | ICD-10-CM | POA: Insufficient documentation

## 2022-05-15 DIAGNOSIS — R519 Headache, unspecified: Secondary | ICD-10-CM | POA: Diagnosis not present

## 2022-05-15 DIAGNOSIS — M2341 Loose body in knee, right knee: Secondary | ICD-10-CM | POA: Insufficient documentation

## 2022-05-15 DIAGNOSIS — Z01818 Encounter for other preprocedural examination: Secondary | ICD-10-CM

## 2022-05-15 DIAGNOSIS — M948X8 Other specified disorders of cartilage, other site: Secondary | ICD-10-CM | POA: Diagnosis not present

## 2022-05-15 HISTORY — PX: KNEE ARTHROSCOPY: SHX127

## 2022-05-15 HISTORY — PX: CHONDROPLASTY: SHX5177

## 2022-05-15 LAB — POCT PREGNANCY, URINE: Preg Test, Ur: NEGATIVE

## 2022-05-15 SURGERY — ARTHROSCOPY, KNEE
Anesthesia: General | Site: Knee | Laterality: Right

## 2022-05-15 MED ORDER — AMISULPRIDE (ANTIEMETIC) 5 MG/2ML IV SOLN: 10.0000 mg | Freq: Once | INTRAVENOUS | Status: DC | PRN

## 2022-05-15 MED ORDER — OXYCODONE HCL 5 MG/5ML PO SOLN: 5.0000 mg | Freq: Once | ORAL | Status: DC | PRN

## 2022-05-15 MED ORDER — ASPIRIN 81 MG PO CHEW: 81.0000 mg | CHEWABLE_TABLET | Freq: Two times a day (BID) | ORAL | 0 refills | Status: AC

## 2022-05-15 MED ORDER — DICLOFENAC SODIUM 75 MG PO TBEC: 75.0000 mg | DELAYED_RELEASE_TABLET | Freq: Two times a day (BID) | ORAL | 0 refills | Status: DC

## 2022-05-15 MED ORDER — ACETAMINOPHEN 500 MG PO TABS
1000.0000 mg | ORAL_TABLET | Freq: Once | ORAL | Status: AC
  Administered 2022-05-15: 1000 mg via ORAL

## 2022-05-15 MED ORDER — GABAPENTIN 300 MG PO CAPS
300.0000 mg | ORAL_CAPSULE | Freq: Once | ORAL | Status: AC
  Administered 2022-05-15: 300 mg via ORAL

## 2022-05-15 MED ORDER — DEXAMETHASONE SODIUM PHOSPHATE 10 MG/ML IJ SOLN
INTRAMUSCULAR | Status: DC | PRN
  Administered 2022-05-15: 10 mg via INTRAVENOUS

## 2022-05-15 MED ORDER — ONDANSETRON HCL 4 MG/2ML IJ SOLN
INTRAMUSCULAR | Status: AC
  Filled 2022-05-15: qty 2

## 2022-05-15 MED ORDER — TRAMADOL HCL 50 MG PO TABS: 50.0000 mg | ORAL_TABLET | Freq: Four times a day (QID) | ORAL | 0 refills | Status: DC | PRN

## 2022-05-15 MED ORDER — PROPOFOL 10 MG/ML IV BOLUS
INTRAVENOUS | Status: AC
  Filled 2022-05-15: qty 20

## 2022-05-15 MED ORDER — SODIUM CHLORIDE 0.9 % IR SOLN
Status: DC | PRN
  Administered 2022-05-15: 3000 mL

## 2022-05-15 MED ORDER — GABAPENTIN 300 MG PO CAPS
ORAL_CAPSULE | ORAL | Status: AC
  Filled 2022-05-15: qty 1

## 2022-05-15 MED ORDER — BUPIVACAINE HCL 0.25 % IJ SOLN
INTRAMUSCULAR | Status: DC | PRN
  Administered 2022-05-15: 20 mL

## 2022-05-15 MED ORDER — LIDOCAINE 2% (20 MG/ML) 5 ML SYRINGE
INTRAMUSCULAR | Status: AC
  Filled 2022-05-15: qty 5

## 2022-05-15 MED ORDER — ONDANSETRON HCL 4 MG PO TABS: 4.0000 mg | ORAL_TABLET | Freq: Three times a day (TID) | ORAL | 0 refills | Status: AC | PRN

## 2022-05-15 MED ORDER — DEXAMETHASONE SODIUM PHOSPHATE 10 MG/ML IJ SOLN
INTRAMUSCULAR | Status: AC
  Filled 2022-05-15: qty 1

## 2022-05-15 MED ORDER — CEFAZOLIN SODIUM-DEXTROSE 2-4 GM/100ML-% IV SOLN
2.0000 g | INTRAVENOUS | Status: AC
  Administered 2022-05-15: 2 g via INTRAVENOUS

## 2022-05-15 MED ORDER — MIDAZOLAM HCL 2 MG/2ML IJ SOLN
INTRAMUSCULAR | Status: AC
  Filled 2022-05-15: qty 2

## 2022-05-15 MED ORDER — KETOROLAC TROMETHAMINE 30 MG/ML IJ SOLN
INTRAMUSCULAR | Status: DC | PRN
  Administered 2022-05-15: 25 mg via INTRAVENOUS

## 2022-05-15 MED ORDER — CEFAZOLIN SODIUM-DEXTROSE 2-4 GM/100ML-% IV SOLN
INTRAVENOUS | Status: AC
  Filled 2022-05-15: qty 100

## 2022-05-15 MED ORDER — OXYCODONE HCL 5 MG PO TABS: 5.0000 mg | ORAL_TABLET | Freq: Once | ORAL | Status: DC | PRN

## 2022-05-15 MED ORDER — PROPOFOL 10 MG/ML IV BOLUS
INTRAVENOUS | Status: DC | PRN
  Administered 2022-05-15: 50 mg via INTRAVENOUS
  Administered 2022-05-15: 200 mg via INTRAVENOUS

## 2022-05-15 MED ORDER — LACTATED RINGERS IV SOLN: INTRAVENOUS | Status: DC

## 2022-05-15 MED ORDER — MIDAZOLAM HCL 5 MG/5ML IJ SOLN
INTRAMUSCULAR | Status: DC | PRN
  Administered 2022-05-15: 2 mg via INTRAVENOUS

## 2022-05-15 MED ORDER — LIDOCAINE HCL (CARDIAC) PF 100 MG/5ML IV SOSY
PREFILLED_SYRINGE | INTRAVENOUS | Status: DC | PRN
  Administered 2022-05-15: 60 mg via INTRAVENOUS

## 2022-05-15 MED ORDER — FENTANYL CITRATE (PF) 100 MCG/2ML IJ SOLN: 25.0000 ug | INTRAMUSCULAR | Status: DC | PRN

## 2022-05-15 MED ORDER — KETOROLAC TROMETHAMINE 30 MG/ML IJ SOLN
INTRAMUSCULAR | Status: AC
  Filled 2022-05-15: qty 1

## 2022-05-15 MED ORDER — ACETAMINOPHEN 500 MG PO TABS
ORAL_TABLET | ORAL | Status: AC
  Filled 2022-05-15: qty 2

## 2022-05-15 MED ORDER — PROMETHAZINE HCL 25 MG/ML IJ SOLN: 6.2500 mg | INTRAMUSCULAR | Status: DC | PRN

## 2022-05-15 MED ORDER — ONDANSETRON HCL 4 MG/2ML IJ SOLN
INTRAMUSCULAR | Status: DC | PRN
  Administered 2022-05-15: 4 mg via INTRAVENOUS

## 2022-05-15 MED ORDER — FENTANYL CITRATE (PF) 100 MCG/2ML IJ SOLN
INTRAMUSCULAR | Status: DC | PRN
  Administered 2022-05-15 (×2): 50 ug via INTRAVENOUS

## 2022-05-15 MED ORDER — FENTANYL CITRATE (PF) 100 MCG/2ML IJ SOLN
INTRAMUSCULAR | Status: AC
  Filled 2022-05-15: qty 2

## 2022-05-15 MED ORDER — ACETAMINOPHEN 500 MG PO TABS: 1000.0000 mg | ORAL_TABLET | Freq: Three times a day (TID) | ORAL | 0 refills | Status: AC

## 2022-05-15 SURGICAL SUPPLY — 37 items
APL PRP STRL LF DISP 70% ISPRP (MISCELLANEOUS) ×1
BANDAGE ESMARK 6X9 LF (GAUZE/BANDAGES/DRESSINGS) IMPLANT
BLADE CLIPPER SURG (BLADE) IMPLANT
BNDG CMPR 9X6 STRL LF SNTH (GAUZE/BANDAGES/DRESSINGS)
BNDG ELASTIC 6X5.8 VLCR STR LF (GAUZE/BANDAGES/DRESSINGS) ×1 IMPLANT
BNDG ESMARK 6X9 LF (GAUZE/BANDAGES/DRESSINGS)
CHLORAPREP W/TINT 26 (MISCELLANEOUS) ×1 IMPLANT
CLSR STERI-STRIP ANTIMIC 1/2X4 (GAUZE/BANDAGES/DRESSINGS) ×1 IMPLANT
COOLER ICEMAN CLASSIC (MISCELLANEOUS) IMPLANT
CUFF TOURN SGL QUICK 34 (TOURNIQUET CUFF)
CUFF TRNQT CYL 34X4.125X (TOURNIQUET CUFF) IMPLANT
DISSECTOR 4.0MMX13CM CVD (MISCELLANEOUS) ×1 IMPLANT
DRAPE ARTHROSCOPY W/POUCH 90 (DRAPES) ×1 IMPLANT
DRAPE IMP U-DRAPE 54X76 (DRAPES) ×1 IMPLANT
DRAPE U-SHAPE 47X51 STRL (DRAPES) ×1 IMPLANT
GAUZE SPONGE 4X4 12PLY STRL (GAUZE/BANDAGES/DRESSINGS) ×1 IMPLANT
GAUZE XEROFORM 1X8 LF (GAUZE/BANDAGES/DRESSINGS) ×1 IMPLANT
GLOVE BIO SURGEON STRL SZ 6.5 (GLOVE) ×1 IMPLANT
GLOVE BIOGEL PI IND STRL 6.5 (GLOVE) ×1 IMPLANT
GLOVE BIOGEL PI IND STRL 8 (GLOVE) ×1 IMPLANT
GLOVE ECLIPSE 8.0 STRL XLNG CF (GLOVE) ×2 IMPLANT
GOWN STRL REUS W/ TWL LRG LVL3 (GOWN DISPOSABLE) ×2 IMPLANT
GOWN STRL REUS W/TWL LRG LVL3 (GOWN DISPOSABLE) ×2
GOWN STRL REUS W/TWL XL LVL3 (GOWN DISPOSABLE) ×1 IMPLANT
KIT TURNOVER KIT B (KITS) ×1 IMPLANT
MANIFOLD NEPTUNE II (INSTRUMENTS) IMPLANT
NS IRRIG 1000ML POUR BTL (IV SOLUTION) IMPLANT
PACK ARTHROSCOPY DSU (CUSTOM PROCEDURE TRAY) ×1 IMPLANT
PAD ARMBOARD 7.5X6 YLW CONV (MISCELLANEOUS) ×2 IMPLANT
PAD COLD SHLDR WRAP-ON (PAD) IMPLANT
PAD ORTHO SHOULDER 7X19 LRG (SOFTGOODS) IMPLANT
PADDING CAST COTTON 6X4 STRL (CAST SUPPLIES) IMPLANT
SLEEVE SCD COMPRESS KNEE MED (STOCKING) IMPLANT
SUT MNCRL AB 4-0 PS2 18 (SUTURE) ×1 IMPLANT
TOWEL GREEN STERILE FF (TOWEL DISPOSABLE) ×1 IMPLANT
TUBING ARTHROSCOPY IRRIG 16FT (MISCELLANEOUS) ×1 IMPLANT
WATER STERILE IRR 1000ML POUR (IV SOLUTION) ×1 IMPLANT

## 2022-05-15 NOTE — Anesthesia Postprocedure Evaluation (Signed)
Anesthesia Post Note  Patient: Kathy Mccoy  Procedure(s) Performed: ARTHROSCOPY KNEE/diagnostic, lLOOSE BODY EXCISION (Right: Knee) CHONDROPLASTY, AND CARTILAGE BIOPSY (Right: Knee)     Patient location during evaluation: PACU Anesthesia Type: General Level of consciousness: awake Pain management: pain level controlled Vital Signs Assessment: post-procedure vital signs reviewed and stable Respiratory status: spontaneous breathing, nonlabored ventilation and respiratory function stable Cardiovascular status: blood pressure returned to baseline and stable Postop Assessment: no apparent nausea or vomiting Anesthetic complications: no   No notable events documented.  Last Vitals:  Vitals:   05/15/22 1130 05/15/22 1147  BP: 118/74 135/74  Pulse: 65 69  Resp: 13 16  Temp:  36.5 C  SpO2: 99% 100%    Last Pain:  Vitals:   05/15/22 1147  TempSrc:   PainSc: 0-No pain                 Kathy Mccoy

## 2022-05-15 NOTE — Op Note (Signed)
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  The medial compartment had grade 1 softening throughout the medial femoral condyle but no obvious other pathology.  There were innumerable small loose bodies throughout the joint that were removed primarily with a shaver.  There was gross full-thickness areas of cartilage loss in the patella though the trochlea was relatively intact.  There was full-thickness loss laterally and centrally as well as involving the inferior pole.  In total it measured 24 x 16 mm in size.  Based on her insurance is requirements we were unable to perform the surgery that we initially planned which would have been a osteotomy and a cartilage transplant.  We did a biopsy for MACI and will come back for an tibial tubercle osteotomy and cartilage transplant.  One of the portals had to be closed with absorbable suture but we left the tails outside of the skin if this bothers the patient and can be removed at her postoperative visit.  Post-operative plan: The patient will be weightbearing as tolerated.  The patient will be discharged home.  DVT prophylaxis Aspirin 81 mg twice daily for 6 weeks.  Pain control with PRN pain medication preferring oral medicines.  Follow up plan will be scheduled in approximately 7 days for incision check.  Post-Op Diagnosis: Same Surgeons:Primary: Hiram Gash, MD Assistants:Caroline McBane PA-C Location: [place] OR ROOM 6 Anesthesia: General with local Antibiotics: Ancef 2 g Tourniquet time:  Estimated Blood Loss: Minimal Complications: None Specimens: None Implants: * No implants in log *  Indications for Surgery:   Kathy Mccoy is a 42 y.o. female with full-thickness cartilage loss in the patellofemoral compartment and pain.  Benefits and risks of operative and nonoperative management were discussed prior to surgery with patient/guardian(s) and informed consent form was completed.  Specific risks including infection, need for additional surgery, continued pain, need for further surgery amongst other things.   Procedure:   The patient was identified properly. Informed consent was obtained and the surgical site was marked. The patient was taken up to suite where general anesthesia was induced. The patient was placed in the supine position with a post against the surgical leg and a nonsterile tourniquet applied. The surgical leg was then prepped and draped usual sterile fashion.  A standard surgical timeout was performed.  2 standard anterior portals were made and diagnostic arthroscopy performed. Please note the findings as noted above.  Patient had innumerable small loose bodies throughout the joint.  We remove these with a shaver.  Careful chondroplasty performed of the patellofemoral compartment.  There was a loose leaflet of tissue inferiorly on the patella and this was removed with a ring curette as well as a shaver.  We cleared the joint again.  Diagnostic arthroscopy was performed the entire joint.  We took a ring curette and took multiple biopsy specimens to be held for MACI from the lateral portion of the notch.  These were 10 mm x 5 mm in size with a small amount of bone attached.  Incisions closed with absorbable suture. The patient was awoken from general anesthesia and taken to the PACU in stable condition without complication.   Noemi Chapel, PA-C, present and scrubbed throughout the case, critical for  completion in a timely fashion, and for retraction, instrumentation, closure.

## 2022-05-15 NOTE — Anesthesia Procedure Notes (Signed)
Procedure Name: LMA Insertion Date/Time: 05/15/2022 10:09 AM  Performed by: Lavonia Dana, CRNAPre-anesthesia Checklist: Patient identified, Emergency Drugs available, Suction available and Patient being monitored Patient Re-evaluated:Patient Re-evaluated prior to induction Oxygen Delivery Method: Circle system utilized Preoxygenation: Pre-oxygenation with 100% oxygen Induction Type: IV induction Ventilation: Mask ventilation without difficulty LMA: LMA inserted LMA Size: 4.0 Number of attempts: 1 Airway Equipment and Method: Bite block Placement Confirmation: positive ETCO2 Tube secured with: Tape Dental Injury: Teeth and Oropharynx as per pre-operative assessment

## 2022-05-15 NOTE — Anesthesia Preprocedure Evaluation (Addendum)
Anesthesia Evaluation  Patient identified by MRN, date of birth, ID band Patient awake    Reviewed: Allergy & Precautions, NPO status , Patient's Chart, lab work & pertinent test results  Airway Mallampati: I  TM Distance: >3 FB Neck ROM: Full    Dental no notable dental hx.    Pulmonary former smoker   Pulmonary exam normal        Cardiovascular Normal cardiovascular exam  POTS (postural orthostatic tachycardia syndrome)   Neuro/Psych  Headaches PSYCHIATRIC DISORDERS  Depression       GI/Hepatic negative GI ROS, Neg liver ROS,,,  Endo/Other  negative endocrine ROS    Renal/GU negative Renal ROS     Musculoskeletal negative musculoskeletal ROS (+)    Abdominal   Peds  Hematology negative hematology ROS (+)   Anesthesia Other Findings right knee subluxation,compartment syndrome,chndromalacia, cartilage tear  Reproductive/Obstetrics Hcg negative                             Anesthesia Physical Anesthesia Plan  ASA: 2  Anesthesia Plan: General   Post-op Pain Management:    Induction: Intravenous  PONV Risk Score and Plan: 3 and Ondansetron, Dexamethasone, Midazolam and Treatment may vary due to age or medical condition  Airway Management Planned: LMA  Additional Equipment:   Intra-op Plan:   Post-operative Plan: Extubation in OR  Informed Consent: I have reviewed the patients History and Physical, chart, labs and discussed the procedure including the risks, benefits and alternatives for the proposed anesthesia with the patient or authorized representative who has indicated his/her understanding and acceptance.     Dental advisory given  Plan Discussed with: CRNA  Anesthesia Plan Comments:        Anesthesia Quick Evaluation

## 2022-05-15 NOTE — Interval H&P Note (Signed)
All questions answered, patient wants to proceed with procedure. ? ?

## 2022-05-15 NOTE — Discharge Instructions (Addendum)
Ophelia Charter MD, MPH Noemi Chapel, PA-C [place] [address] [phone] (tel)   [phone] (fax)  No tylenol until 3:15 p.m. No ibuprofen until 4:30 p.m.  POST-OPERATIVE INSTRUCTIONS - Knee Arthroscopy  WOUND CARE - You may remove the Operative Dressing on Post-Op Day #3 (72hrs after surgery).   -  Alternatively if you would like you can leave dressing on until follow-up if within 7-8 days but keep it dry. - Leave steri-strips in place until they fall off on their own, usually 2 weeks postop. - An ACE wrap may be used to control swelling, do not wrap this too tight.  If the initial ACE wrap feels too tight you may loosen it. - There may be a small amount of fluid/bleeding leaking at the surgical site.  - This is normal; the knee is filled with fluid during the procedure and can leak for 24-48hrs after surgery. You may change/reinforce the bandage as needed.  - Use the Cryocuff or Ice as often as possible for the first 7 days, then as needed for pain relief. Always keep a towel, ACE wrap or other barrier between the cooling unit and your skin.  - You may shower on Post-Op Day #3. Gently pat the area dry.  - Do not soak the knee in water or submerge it.  - Do not go swimming in the pool or ocean until 4 weeks after surgery or when otherwise instructed.  Keep incisions as dry as possible.   BRACE/AMBULATION  -            You will not need a brace after this procedure.   - You may use crutches initially to help you weight bear, but this is not required - You can put full weight on your operative leg as you feel comfortable  PHYSICAL THERAPY - You will begin physical therapy soon after surgery (unless otherwise specified) - Please call to set up an appointment, if you do not already have one  - Let our office if there are any issues with scheduling your therapy  - You have a physical therapy appointment scheduled at [facility] (across the hall from  our office) on Tuesday January 30th at [place] The anesthesia team may have performed a nerve block for you this is a great tool used to minimize pain.   The block may start wearing off overnight (between 8-24 hours postop) When the block wears off, your pain may go from nearly zero to the pain you would have had postop without the block. This is an abrupt transition but nothing dangerous is happening.   This can be a challenging period but utilize your as needed pain medications to try and manage this period. We suggest you use the pain medication the first night prior to going to bed, to ease this transition.  You may take an extra dose of narcotic when this happens if needed   POST-OP MEDICATIONS- Multimodal approach to pain control In general your pain will be controlled with a combination of substances.  Prescriptions unless otherwise discussed are electronically sent to your pharmacy.  This is a carefully made plan we use to minimize narcotic use.     Diclofenac - Anti-inflammatory medication taken on a scheduled basis Acetaminophen - Non-narcotic pain medicine taken on a scheduled basis  Tramadol - This is a strong narcotic, to be used only on an "as needed" basis for SEVERE pain. Aspirin 81mg  -  This medicine is used to minimize the risk of blood clots after surgery. Zofran - take as needed for nausea    FOLLOW-UP   Please call the office to schedule a follow-up appointment for your incision check, 7-10 days post-operatively.   IF YOU HAVE ANY QUESTIONS, PLEASE FEEL FREE TO CALL OUR OFFICE.   HELPFUL INFORMATION   Keep your leg elevated to decrease swelling, which will then in turn decrease your pain. I would elevate the foot of your bed by putting a couple of couch pillows between your mattress and box spring. I would not keep pillow directly under your ankle.  - Do not sleep with a pillow behind your knee even if it is more comfortable as  this may make it harder to get your knee fully straight long term.   There will be MORE swelling on days 1-3 than there is on the day of surgery.  This also is normal. The swelling will decrease with the anti-inflammatory medication, ice and keeping it elevated. The swelling will make it more difficult to bend your knee. As the swelling goes down your motion will become easier   You may develop swelling and bruising that extends from your knee down to your calf and perhaps even to your foot over the next week. Do not be alarmed. This too is normal, and it is due to gravity   There may be some numbness adjacent to the incision site. This may last for 6-12 months or longer in some patients and is expected.   You may return to sedentary work/school in the next couple of days when you feel up to it. You will need to keep your leg elevated as much as possible    You should wean off your narcotic medicines as soon as you are able.  Most patients will be off or using minimal narcotics before their first postop appointment.   - You should be off narcotics before your first post-operative visit   We suggest you use the pain medication the first night prior to going to bed, in order to ease any pain when the anesthesia wears off. You should avoid taking pain medications on an empty stomach as it will make you nauseous.   Do not drink alcoholic beverages or take illicit drugs when taking pain medications.   It is against the law to drive while taking narcotics. You cannot drive if your Right leg is in brace locked in extension.   Pain medication may make you constipated.  Below are a few solutions to try in this order:  o Decrease the amount of pain medication if you aren't having pain.  o Drink lots of decaffeinated fluids.  o Drink prune juice and/or eat dried prunes   o If the first 3 don't work start with additional solutions  o Take Colace - an over-the-counter stool softener  o Take  Senokot - an over-the-counter laxative  o Take Miralax - a stronger over-the-counter laxative    For more information including helpful videos and documents visit our website:   [email or web address]    Post Anesthesia Home Care Instructions  Activity: Get plenty of rest for the remainder of the day. A responsible individual must stay with you for 24 hours following the procedure.  For the next 24 hours, DO NOT: -Drive a car -Paediatric nurse -Drink alcoholic beverages -Take any medication unless instructed by your physician -Make any legal decisions or sign important papers.  Meals: Start with liquid foods such  as gelatin or soup. Progress to regular foods as tolerated. Avoid greasy, spicy, heavy foods. If nausea and/or vomiting occur, drink only clear liquids until the nausea and/or vomiting subsides. Call your physician if vomiting continues.  Special Instructions/Symptoms: Your throat may feel dry or sore from the anesthesia or the breathing tube placed in your throat during surgery. If this causes discomfort, gargle with warm salt water. The discomfort should disappear within 24 hours.  If you had a scopolamine patch placed behind your ear for the management of post- operative nausea and/or vomiting:  1. The medication in the patch is effective for 72 hours, after which it should be removed.  Wrap patch in a tissue and discard in the trash. Wash hands thoroughly with soap and water. 2. You may remove the patch earlier than 72 hours if you experience unpleasant side effects which may include dry mouth, dizziness or visual disturbances. 3. Avoid touching the patch. Wash your hands with soap and water after contact with the patch.

## 2022-05-15 NOTE — Transfer of Care (Signed)
Immediate Anesthesia Transfer of Care Note  Patient: Kathy Mccoy  Procedure(s) Performed: ARTHROSCOPY KNEE/diagnostic, lLOOSE BODY EXCISION (Right: Knee) CHONDROPLASTY, AND CARTILAGE BIOPSY (Right: Knee)  Patient Location: PACU  Anesthesia Type:General  Level of Consciousness: drowsy  Airway & Oxygen Therapy: Patient Spontanous Breathing and Patient connected to face mask oxygen  Post-op Assessment: Report given to RN and Post -op Vital signs reviewed and stable  Post vital signs: Reviewed and stable  Last Vitals:  Vitals Value Taken Time  BP 120/82 05/15/22 1103  Temp    Pulse 89 05/15/22 1104  Resp 12 05/15/22 1104  SpO2 100 % 05/15/22 1104  Vitals shown include unvalidated device data.  Last Pain:  Vitals:   05/15/22 0904  TempSrc: Oral  PainSc: 0-No pain         Complications: No notable events documented.

## 2022-05-16 ENCOUNTER — Encounter (HOSPITAL_BASED_OUTPATIENT_CLINIC_OR_DEPARTMENT_OTHER): Payer: Self-pay | Admitting: Orthopaedic Surgery

## 2022-05-19 ENCOUNTER — Emergency Department (HOSPITAL_BASED_OUTPATIENT_CLINIC_OR_DEPARTMENT_OTHER): Payer: BC Managed Care – PPO

## 2022-05-19 ENCOUNTER — Other Ambulatory Visit: Payer: Self-pay

## 2022-05-19 ENCOUNTER — Encounter (HOSPITAL_BASED_OUTPATIENT_CLINIC_OR_DEPARTMENT_OTHER): Payer: Self-pay | Admitting: Emergency Medicine

## 2022-05-19 ENCOUNTER — Emergency Department (HOSPITAL_BASED_OUTPATIENT_CLINIC_OR_DEPARTMENT_OTHER)
Admission: EM | Admit: 2022-05-19 | Discharge: 2022-05-19 | Disposition: A | Discharge status: Home / Self Care | Payer: BC Managed Care – PPO | Attending: Emergency Medicine | Admitting: Emergency Medicine

## 2022-05-19 DIAGNOSIS — I824Y1 Acute embolism and thrombosis of unspecified deep veins of right proximal lower extremity: Secondary | ICD-10-CM | POA: Diagnosis not present

## 2022-05-19 DIAGNOSIS — Z7982 Long term (current) use of aspirin: Secondary | ICD-10-CM | POA: Diagnosis not present

## 2022-05-19 DIAGNOSIS — I82432 Acute embolism and thrombosis of left popliteal vein: Secondary | ICD-10-CM | POA: Diagnosis not present

## 2022-05-19 DIAGNOSIS — Z9889 Other specified postprocedural states: Secondary | ICD-10-CM | POA: Diagnosis not present

## 2022-05-19 DIAGNOSIS — M79661 Pain in right lower leg: Secondary | ICD-10-CM | POA: Diagnosis not present

## 2022-05-19 DIAGNOSIS — Z7901 Long term (current) use of anticoagulants: Secondary | ICD-10-CM | POA: Diagnosis not present

## 2022-05-19 DIAGNOSIS — M2241 Chondromalacia patellae, right knee: Secondary | ICD-10-CM | POA: Diagnosis not present

## 2022-05-19 LAB — CBC WITH DIFFERENTIAL/PLATELET
Abs Immature Granulocytes: 0.03 10*3/uL (ref 0.00–0.07)
Basophils Absolute: 0 10*3/uL (ref 0.0–0.1)
Basophils Relative: 0 %
Eosinophils Absolute: 0 10*3/uL (ref 0.0–0.5)
Eosinophils Relative: 0 %
HCT: 43.6 % (ref 36.0–46.0)
Hemoglobin: 14.8 g/dL (ref 12.0–15.0)
Immature Granulocytes: 0 %
Lymphocytes Relative: 22 %
Lymphs Abs: 1.7 10*3/uL (ref 0.7–4.0)
MCH: 33 pg (ref 26.0–34.0)
MCHC: 33.9 g/dL (ref 30.0–36.0)
MCV: 97.1 fL (ref 80.0–100.0)
Monocytes Absolute: 0.5 10*3/uL (ref 0.1–1.0)
Monocytes Relative: 7 %
Neutro Abs: 5.5 10*3/uL (ref 1.7–7.7)
Neutrophils Relative %: 71 %
Platelets: 206 10*3/uL (ref 150–400)
RBC: 4.49 MIL/uL (ref 3.87–5.11)
RDW: 12.7 % (ref 11.5–15.5)
WBC: 7.8 10*3/uL (ref 4.0–10.5)
nRBC: 0 % (ref 0.0–0.2)

## 2022-05-19 LAB — BASIC METABOLIC PANEL
Anion gap: 8 (ref 5–15)
BUN: 15 mg/dL (ref 6–20)
CO2: 26 mmol/L (ref 22–32)
Calcium: 10.3 mg/dL (ref 8.9–10.3)
Chloride: 105 mmol/L (ref 98–111)
Creatinine, Ser: 0.73 mg/dL (ref 0.44–1.00)
GFR, Estimated: >60 mL/min (ref >60)
Glucose, Bld: 114 mg/dL — ABNORMAL HIGH (ref 70–99)
Potassium: 4 mmol/L (ref 3.5–5.1)
Sodium: 139 mmol/L (ref 135–145)

## 2022-05-19 MED ORDER — OXYCODONE HCL 5 MG PO TABS
5.0000 mg | ORAL_TABLET | Freq: Once | ORAL | Status: AC
  Administered 2022-05-19: 5 mg via ORAL
  Filled 2022-05-19: qty 1

## 2022-05-19 MED ORDER — APIXABAN (ELIQUIS) VTE STARTER PACK (10MG AND 5MG): ORAL_TABLET | ORAL | 0 refills | Status: DC

## 2022-05-19 MED ORDER — APIXABAN 2.5 MG PO TABS
10.0000 mg | ORAL_TABLET | Freq: Once | ORAL | Status: AC
  Administered 2022-05-19: 10 mg via ORAL
  Filled 2022-05-19: qty 4

## 2022-05-19 NOTE — Discharge Instructions (Signed)
Follow-up with your primary care doctor and orthopedic.  Will need to continue anticoagulation until instructed otherwise.  Make sure you have close follow-up with your primary care doctor to ensure that you get future prescriptions.

## 2022-05-19 NOTE — ED Provider Notes (Signed)
[facility] Provider Note   CSN: [id number] Arrival date & time: 05/19/22  1810     History  Chief Complaint  Patient presents with   Leg Pain    Kathy Mccoy is a 42 y.o. female.  Patient here with pain in her right calf for last few days.  Feels tight.  She just had small procedures done to her right knee to prepare for surgery that she is having in the future.  Sent here for ultrasound.  Denies any numbness or weakness.  Foot feels cold at times.  Patient has history of anxiety and depression and migraines.  She had knee arthroscopy 4 days ago.  Was not hospitalized.  No history of blood clots in the family  The history is provided by the patient.       Home Medications Prior to Admission medications   Medication Sig Start Date End Date Taking? Authorizing Provider  APIXABAN (ELIQUIS) VTE STARTER PACK (10MG  AND 5MG ) Take as directed on package: start with two-5mg  tablets twice daily for 7 days. On day 8, switch to one-5mg  tablet twice daily. 05/19/22  Yes Suzette Flagler, DO  acetaminophen (TYLENOL) 500 MG tablet Take 2 tablets (1,000 mg total) by mouth every 8 (eight) hours for 14 days. 05/15/22 05/29/22  Ethelda Chick, PA-C  aspirin (ASPIRIN CHILDRENS) 81 MG chewable tablet Chew 1 tablet (81 mg total) by mouth 2 (two) times daily. For 6 weeks for DVT prophylaxis after surgery 05/15/22 06/26/22  Ethelda Chick, PA-C  diazepam (VALIUM) 5 MG tablet Take 1 tablet (5 mg total) by mouth every 12 (twelve) hours as needed for anxiety. 04/04/22   Biagio Borg, MD  diclofenac (VOLTAREN) 75 MG EC tablet Take 1 tablet (75 mg total) by mouth 2 (two) times daily. 05/15/22   McBane, Maylene Roes, PA-C  gabapentin (NEURONTIN) 300 MG capsule Take 1 capsule (300 mg total) by mouth 3 (three) times daily. 05/03/20   Biagio Borg, MD  levonorgestrel (MIRENA) 20 MCG/24HR IUD 1 each by Intrauterine route once.    [provider]  ondansetron  (ZOFRAN) 4 MG tablet Take 1 tablet (4 mg total) by mouth every 8 (eight) hours as needed for up to 7 days for nausea or vomiting. 05/15/22 05/22/22  McBane, Maylene Roes, PA-C  rizatriptan (MAXALT) 10 MG tablet TAKE 1 TABLET BY MOUTH AS NEEDED FOR MIGRAINE. MAY REPEAT IN 2 HOURS IF NEEDED. 05/05/22   Biagio Borg, MD  tiZANidine (ZANAFLEX) 4 MG tablet Take 2 mg by mouth 3 (three) times daily as needed. 02/04/22   [provider]  traMADol (ULTRAM) 50 MG tablet Take 1 tablet (50 mg total) by mouth every 6 (six) hours as needed for severe pain. 05/15/22   Ethelda Chick, PA-C      Allergies    Moxifloxacin    Review of Systems   Review of Systems  Physical Exam Updated Vital Signs BP (!) 147/90 (BP Location: Right Arm)   Pulse (!) 102   Temp 98.4 F (36.9 C)   Resp 18   SpO2 100%  Physical Exam Vitals and nursing note reviewed.  Constitutional:      General: She is not in acute distress.    Appearance: She is well-developed.  HENT:     Head: Normocephalic and atraumatic.  Eyes:     Conjunctiva/sclera: Conjunctivae normal.  Cardiovascular:     Rate and Rhythm: Normal rate and regular rhythm.  Pulses: Normal pulses.     Heart sounds: Normal heart sounds. No murmur heard. Pulmonary:     Effort: Pulmonary effort is normal. No respiratory distress.     Breath sounds: Normal breath sounds.  Abdominal:     Palpations: Abdomen is soft.     Tenderness: There is no abdominal tenderness.  Musculoskeletal:        General: Tenderness present. No swelling.     Cervical back: Neck supple.     Right lower leg: Edema present.     Comments: Tenderness to the right calf  Skin:    General: Skin is warm and dry.     Capillary Refill: Capillary refill takes less than 2 seconds.  Neurological:     Mental Status: She is alert.  Psychiatric:        Mood and Affect: Mood normal.     ED Results / Procedures / Treatments   Labs (all labs ordered are listed, but only abnormal  results are displayed) Labs Reviewed  BASIC METABOLIC PANEL - Abnormal; Notable for the following components:      Result Value   Glucose, Bld 114 (*)    All other components within normal limits  CBC WITH DIFFERENTIAL/PLATELET    EKG None  Radiology US Venous Img Lower Right (DVT Study)  Result Date: 05/19/2022 CLINICAL DATA:  Pain, postoperative 4 days. EXAM: RIGHT LOWER EXTREMITY VENOUS DOPPLER ULTRASOUND TECHNIQUE: Gray-scale sonography with compression, as well as color and duplex ultrasound, were performed to evaluate the deep venous system(s) from the level of the common femoral vein through the popliteal and proximal calf veins. COMPARISON:  None Available. FINDINGS: VENOUS Normal compressibility and flow seen within the common femoral, superficial femoral, and popliteal veins. There is nonocclusive thrombus identified in the posterior tibial vein and occlusive thrombus identified in the soleal vein. There is normal flow identified within the peroneal vein.Limited views of the contralateral common femoral vein are unremarkable. OTHER None. Limitations: none IMPRESSION: Nonocclusive deep venous thrombus identified in the posterior tibial vein and occlusive thrombus identified in the soleal vein. Electronically Signed   By: Ronney Asters M.D.   On: 05/19/2022 19:20    Procedures Procedures    Medications Ordered in ED Medications  apixaban (ELIQUIS) tablet 10 mg (has no administration in time range)  oxyCODONE (Oxy IR/ROXICODONE) immediate release tablet 5 mg (5 mg Oral Given 05/19/22 1907)    ED Course/ Medical Decision Making/ A&P                             Medical Decision Making Amount and/or Complexity of Data Reviewed Labs: ordered.  Risk Prescription drug management.   Kathy Mccoy is here with right calf pain.  Knee surgery 4 days ago.  Sent for DVT study by orthopedics.  Normal vitals.  No fever.  She is neurovascularly and neuromuscular intact on exam.  She has  strong pulses in her feet.  She is got right calf tenderness with mild swelling in the calf.  DVT study was ordered and shows nonocclusive deep vein thrombosis in the posterior tibialis vein as well as occlusive thrombus identified in the soleal vein.  Lab work was done and per my review and interpretation is unremarkable.  Will start the patient on Eliquis.  She was educated about Eliquis.  Will have her follow-up with her primary care doctor and orthopedics.  Discharged in good condition.  This chart was dictated using voice  recognition software.  Despite best efforts to proofread,  errors can occur which can change the documentation meaning.         Final Clinical Impression(s) / ED Diagnoses Final diagnoses:  Acute deep vein thrombosis (DVT) of proximal vein of right lower extremity (HCC)    Rx / DC Orders ED Discharge Orders          Ordered    APIXABAN (ELIQUIS) VTE STARTER PACK (10MG  AND 5MG )        05/19/22 2010              , DO 05/19/22 2012

## 2022-05-19 NOTE — ED Triage Notes (Signed)
Pt arrives to ED with c/o right calf pain and tightness that started yesterday. Has been in bed past three days. Had right knee surgery x4 days ago.

## 2022-06-04 ENCOUNTER — Ambulatory Visit (INDEPENDENT_AMBULATORY_CARE_PROVIDER_SITE_OTHER): Payer: BC Managed Care – PPO | Admitting: Internal Medicine

## 2022-06-04 VITALS — BP 98/64 | HR 76 | Temp 98.1°F | Ht 67.0 in | Wt 117.1 lb

## 2022-06-04 DIAGNOSIS — M25561 Pain in right knee: Secondary | ICD-10-CM | POA: Diagnosis not present

## 2022-06-04 DIAGNOSIS — G8929 Other chronic pain: Secondary | ICD-10-CM

## 2022-06-04 DIAGNOSIS — I82401 Acute embolism and thrombosis of unspecified deep veins of right lower extremity: Secondary | ICD-10-CM

## 2022-06-04 DIAGNOSIS — R739 Hyperglycemia, unspecified: Secondary | ICD-10-CM | POA: Diagnosis not present

## 2022-06-04 MED ORDER — DIAZEPAM 5 MG PO TABS: 5.0000 mg | ORAL_TABLET | Freq: Every day | ORAL | 2 refills | Status: DC | PRN

## 2022-06-04 MED ORDER — APIXABAN 5 MG PO TABS: 5.0000 mg | ORAL_TABLET | Freq: Two times a day (BID) | ORAL | 1 refills | Status: DC

## 2022-06-04 NOTE — Patient Instructions (Signed)
Please continue all other medications as before, and refills have been done if requested - the eliquis and valium  The eliquis should be taken for total of 6 months, with a break at the 3 mo mark for the right knee procedure  Please have the pharmacy call with any other refills you may need.  Please continue your efforts at being more active, low cholesterol diet, and weight control.  Please keep your appointments with your specialists as you may have planned  Please go to the LAB at the blood drawing area for the tests to be done - the "hypercoagulable panel"  Please remember you may need this panel repeated eventaully after the eliquis is finished, to make sure it is negative  You will be contacted by phone if any changes need to be made immediately.  Otherwise, you will receive a letter about your results with an explanation, but please check with MyChart first.  Please remember to sign up for MyChart if you have not done so, as this will be important to you in the future with finding out test results, communicating by private email, and scheduling acute appointments online when needed.  Please make an Appointment to return in 6 months, or sooner if needed

## 2022-06-04 NOTE — Progress Notes (Signed)
Patient ID: Kathy Mccoy, female   DOB: April 21, 1981, 42 y.o.   MRN: [id number]        Chief Complaint: follow up right calf acute DVT post right knee cartilage harvesting; hyperglycemia       HPI:  Kathy Mccoy is a 42 y.o. female here after right knee cartilage harvesting procedure jan 25 with plan to grow the [facility] and transplant back at 2 months, unfortunately complicated by right LE actue DVT, seen at ED jan 29, tx with eliquis starter pack, needs renewed,  Pt denies chest pain, increased sob or doe, wheezing, orthopnea, PND, increased LE swelling, palpitations, dizziness or syncope.   Pt denies polydipsia, polyuria, or new focal neuro s/s.    Pt denies fever, wt loss, night sweats, loss of appetite, or other constitutional symptoms  Was suggested to her that she should have a test such as the hypercoag panel to further assess.  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;  Service: Orthopedics;   Laterality: Right;   s/p rhinoplasty  04/22/2007    reports that she has quit smoking. She has never used smokeless tobacco. She reports current alcohol use. She reports that she does not use drugs. family history includes Breast cancer in her maternal grandmother; Hyperlipidemia in her father. Allergies  Allergen Reactions   Moxifloxacin Rash and Hives   Current Outpatient Medications on File Prior to Visit  Medication Sig Dispense Refill   levonorgestrel (MIRENA) 20 MCG/24HR IUD 1 each by Intrauterine route once.     rizatriptan (MAXALT) 10 MG tablet TAKE 1 TABLET BY MOUTH AS NEEDED FOR MIGRAINE. MAY REPEAT IN 2 HOURS IF NEEDED. 10 tablet 11   tiZANidine (ZANAFLEX) 4 MG tablet Take 2 mg by mouth 3 (three) times daily as needed.     aspirin (ASPIRIN CHILDRENS) 81 MG chewable tablet Chew 1 tablet (81 mg total) by mouth 2 (two) times daily. For 6 weeks for DVT prophylaxis after surgery (Patient not taking: Reported on 06/04/2022) 84 tablet 0   traMADol (ULTRAM) 50 MG tablet Take 1 tablet (50 mg total) by mouth every 6 (six) hours as needed for severe pain. (Patient not taking: Reported on 06/04/2022) 20 tablet 0   No current facility-administered medications on file prior to visit.        ROS:  All others reviewed and negative.  Objective        PE:  BP 98/64  Pulse 76   Temp 98.1 F (36.7 C) (Temporal)   Ht 5' 7"$  (1.702 m)   Wt 117 lb 2 oz (53.1 kg)   SpO2 99%   BMI 18.34 kg/m                 Constitutional: Pt appears in NAD               HENT: Head: NCAT.                Right Ear: External ear normal.                 Left Ear: External ear normal.                Eyes: . Pupils are equal, round, and reactive to light. Conjunctivae and EOM are normal               Nose: without d/c or deformity               Neck: Neck supple. Gross normal ROM               Cardiovascular: Normal rate and regular rhythm.                 Pulmonary/Chest: Effort normal and breath sounds without  rales or wheezing.                Abd:  Soft, NT, ND, + BS, no organomegaly               Neurological: Pt is alert. At baseline orientation, motor grossly intact               Skin: Skin is warm. No rashes, no other new lesions, LE edema - trace RLE swelling               Psychiatric: Pt behavior is normal without agitation   Micro: none  Cardiac tracings I have personally interpreted today:  none  Pertinent Radiological findings (summarize): none   Lab Results  Component Value Date   WBC 7.8 05/19/2022   HGB 14.8 05/19/2022   HCT 43.6 05/19/2022   PLT 206 05/19/2022   GLUCOSE 114 (H) 05/19/2022   CHOL 147 06/22/2018   TRIG 82.0 06/22/2018   HDL 65.90 06/22/2018   LDLCALC 64 06/22/2018   ALT 9 06/22/2018   AST 13 06/22/2018   NA 139 05/19/2022   K 4.0 05/19/2022   CL 105 05/19/2022   CREATININE 0.73 05/19/2022   BUN 15 05/19/2022   CO2 26 05/19/2022   TSH 1.00 06/22/2018   HGBA1C 5.2 06/22/2018   Assessment/Plan:  Kathy Mccoy is a 42 y.o. White or Caucasian [1] female with  has a past medical history of Acute pharyngitis (03/23/2010), ANXIETY DEPRESSION (01/02/2009), CARDIAC ARRHYTHMIA (01/12/2009), COMMON MIGRAINE (01/02/2009), POTS (postural orthostatic tachycardia syndrome), PREMATURE VENTRICULAR CONTRACTIONS (02/21/2009), SINUSITIS- ACUTE-NOS (08/15/2009), and SYNCOPE (01/02/2009).  Hyperglycemia Lab Results  Component Value Date   HGBA1C 5.2 06/22/2018   Stable, pt to continue current medical treatment  - diet wt control   Right knee pain D/w pt surgical plan - her planned 2 mo right knee cartilage reimplantation should ideally be extended to 3 months with eliquis holiday periop, then restart for 3 more mo for total 6 mo tx; for hypercoagulable panel today with the caveat that results may not be accurate on active eliquis tx, and may need to reconsider post eliquis tx; overall pt appears to have  provoked acute dvt and likely does not need heme referral except at  her preference  Right leg DVT ([place]) For renewed eliquis 5 mg bid and o/w as documented above  Followup: Return in about 6 months (around 12/03/2022).  Cathlean Cower, MD 06/07/2022 3:35 PM [facility]

## 2022-06-07 ENCOUNTER — Encounter: Payer: Self-pay | Admitting: Internal Medicine

## 2022-06-07 DIAGNOSIS — I82401 Acute embolism and thrombosis of unspecified deep veins of right lower extremity: Secondary | ICD-10-CM | POA: Insufficient documentation

## 2022-06-07 DIAGNOSIS — M25561 Pain in right knee: Secondary | ICD-10-CM | POA: Insufficient documentation

## 2022-06-07 NOTE — Assessment & Plan Note (Signed)
Lab Results  Component Value Date   HGBA1C 5.2 06/22/2018   Stable, pt to continue current medical treatment  - diet wt control

## 2022-06-07 NOTE — Assessment & Plan Note (Signed)
D/w pt surgical plan - her planned 2 mo right knee cartilage reimplantation should ideally be extended to 3 months with eliquis holiday periop, then restart for 3 more mo for total 6 mo tx; for hypercoagulable panel today with the caveat that results may not be accurate on active eliquis tx, and may need to reconsider post eliquis tx; overall pt appears to have provoked acute dvt and likely does not need heme referral except at her preference

## 2022-06-07 NOTE — Assessment & Plan Note (Signed)
For renewed eliquis 5 mg bid and o/w as documented above

## 2022-06-09 ENCOUNTER — Other Ambulatory Visit: Payer: Self-pay | Admitting: Internal Medicine

## 2022-06-09 DIAGNOSIS — I82401 Acute embolism and thrombosis of unspecified deep veins of right lower extremity: Secondary | ICD-10-CM

## 2022-06-18 LAB — HYPERCOAGULABLE PANEL, COMPREHENSIVE
APTT: 24.5 s
AT III Act/Nor PPP Chro: 98 %
Act. Prt C Resist w/FV Defic.: 3 ratio
Anticardiolipin Ab, IgG: <10 [GPL'U]
Anticardiolipin Ab, IgM: <10 [MPL'U]
Beta-2 Glycoprotein I, IgA: <10 SAU
Beta-2 Glycoprotein I, IgG: <10 SGU
Beta-2 Glycoprotein I, IgM: <10 SMU
DRVVT Screen Seconds: 31.3 s
Factor VII Antigen**: 87 %
Factor VIII Activity: 157 %
Hexagonal Phospholipid Neutral: 3 s
Homocysteine: 8.7 umol/L
Prot C Ag Act/Nor PPP Imm: 92 %
Prot S Ag Act/Nor PPP Imm: 65 %
Protein C Ag/FVII Ag Ratio**: 1.1 ratio
Protein S Ag/FVII Ag Ratio**: 0.7 ratio

## 2022-08-05 DIAGNOSIS — M2241 Chondromalacia patellae, right knee: Secondary | ICD-10-CM | POA: Diagnosis not present

## 2022-08-28 ENCOUNTER — Telehealth: Payer: Self-pay | Admitting: Internal Medicine

## 2022-08-28 MED ORDER — DIAZEPAM 5 MG PO TABS: 5.0000 mg | ORAL_TABLET | Freq: Every day | ORAL | 2 refills | Status: DC | PRN

## 2022-08-28 NOTE — Telephone Encounter (Signed)
Done erx 

## 2022-08-28 NOTE — Telephone Encounter (Signed)
Prescription Request  08/28/2022  LOV: 06/04/2022  What is the name of the medication or equipment? Diazapam 5 mg.  Have you contacted your pharmacy to request a refill? Yes   Which pharmacy would you like this sent to?  [id number] [place], [place] - [address] AT [facility] [address] [facility] [place] [address] Phone: [phone] Fax: [phone]    Patient notified that their request is being sent to the clinical staff for review and that they should receive a response within 2 business days.   Please advise at Mobile [phone] (mobile)

## 2022-09-24 ENCOUNTER — Other Ambulatory Visit: Payer: Self-pay

## 2022-09-24 ENCOUNTER — Encounter (HOSPITAL_BASED_OUTPATIENT_CLINIC_OR_DEPARTMENT_OTHER): Payer: Self-pay | Admitting: Orthopaedic Surgery

## 2022-09-24 NOTE — Progress Notes (Signed)
Message left for Sherri at Dr. Austin Miles office regarding Eliquis orders.

## 2022-09-25 ENCOUNTER — Telehealth: Payer: Self-pay

## 2022-09-25 NOTE — Telephone Encounter (Signed)
Hold eliquis 2 days prio

## 2022-09-25 NOTE — Telephone Encounter (Signed)
Called and let her know

## 2022-09-26 NOTE — Progress Notes (Signed)
Message left for patient to pick up presurgical soap and presurgical drink at [facility] per surgeon orders.

## 2022-09-26 NOTE — Progress Notes (Signed)
       Patient Instructions  The night before surgery:  No food after midnight. ONLY clear liquids after midnight  The day of surgery (if you do NOT have diabetes):  Drink ONE (1) Pre-Surgery Clear Ensure as directed.   This drink was given to you during your hospital  pre-op appointment visit. The pre-op nurse will instruct you on the time to drink the  Pre-Surgery Ensure depending on your surgery time. Finish the drink at the designated time by the pre-op nurse.  Nothing else to drink after completing the  Pre-Surgery Clear Ensure.  The day of surgery (if you have diabetes): Drink ONE (1) Gatorade 2 (G2) as directed. This drink was given to you during your hospital  pre-op appointment visit.  The pre-op nurse will instruct you on the time to drink the   Gatorade 2 (G2) depending on your surgery time. Color of the Gatorade may vary. Red is not allowed. Nothing else to drink after completing the  Gatorade 2 (G2).         If you have questions, please contact your surgeon's office.   Surgical soap given to patient with instructions and patient verbalized understanding.  

## 2022-10-01 NOTE — H&P (Signed)
PREOPERATIVE H&P  Chief Complaint: right patella condral lesion, compartment syndrome, synovitis, cartilage disorder  HPI: Kathy Mccoy is a 42 y.o. female who is scheduled for, Procedure(s): FASCIOTOMY SYNOVECTOMY KNEE ARTHROSCOPY WITH IMPLANTATION OF MATRIX-INDUCED AUTOLOGOUS CHONDROCYTE (MACI) TIBIAL TUBERCLE TRANSFER.   Patient has a past medical history significant for DVT, POTS.   She had a right knee chondroplasty and cartilage biopsy on January 25th.  She, unfortunately, had a DVT after surgery.  She is waiting to get approved for a cartilage transplant with [facility] and a tibial tubercle osteotomy.  She is frustrated by how much pain she is having in her knee.  She is taking antiinflammatories without any improvement.   Symptoms are rated as moderate to severe, and have been worsening.  This is significantly impairing activities of daily living.    Please see clinic note for further details on this patient's care.    She has elected for surgical management.   Past Medical History:  Diagnosis Date   Acute pharyngitis 03/23/2010   ANXIETY DEPRESSION 01/02/2009   CARDIAC ARRHYTHMIA 01/12/2009   COMMON MIGRAINE 01/02/2009   POTS (postural orthostatic tachycardia syndrome)    PREMATURE VENTRICULAR CONTRACTIONS 02/21/2009   SINUSITIS- ACUTE-NOS 08/15/2009   SYNCOPE 01/02/2009   Past Surgical History:  Procedure Laterality Date   CHONDROPLASTY Right 05/15/2022   Procedure: CHONDROPLASTY, AND CARTILAGE BIOPSY;  Surgeon: Bjorn Pippin, MD;  Location: [facility];  Service: Orthopedics;  Laterality: Right;   KNEE ARTHROSCOPY     KNEE ARTHROSCOPY Right 05/15/2022   Procedure: ARTHROSCOPY KNEE/diagnostic, lLOOSE BODY EXCISION;  Surgeon: Bjorn Pippin, MD;  Location: [facility];  Service: Orthopedics;  Laterality: Right;   s/p rhinoplasty  04/22/2007   Social History   Socioeconomic History   Marital status: Single    Spouse name: Not on file    Number of children: 2   Years of education: Not on file   Highest education level: Not on file  Occupational History   Occupation: brokerage admin support    Employer: [employer]  Tobacco Use   Smoking status: Former    Types: Cigarettes    Quit date: 2019    Years since quitting: 5.4   Smokeless tobacco: Never  Vaping Use   Vaping Use: Never used  Substance and Sexual Activity   Alcohol use: Yes    Comment: rare   Drug use: Yes    Types: Marijuana    Comment: occ   Sexual activity: Not on file  Other Topics Concern   Not on file  Social History Narrative   Not on file   Social Determinants of Health   Financial Resource Strain: Not on file  Food Insecurity: Not on file  Transportation Needs: Not on file  Physical Activity: Not on file  Stress: Not on file  Social Connections: Not on file   Family History  Problem Relation Age of Onset   Hyperlipidemia Father    Breast cancer Maternal Grandmother    Allergies  Allergen Reactions   Moxifloxacin Rash and Hives   Prior to Admission medications   Medication Sig Start Date End Date Taking? Authorizing Provider  apixaban (ELIQUIS) 5 MG TABS tablet Take 1 tablet (5 mg total) by mouth 2 (two) times daily. 06/04/22  Yes Corwin Levins, MD  celecoxib (CELEBREX) 200 MG capsule Take 200 mg by mouth 2 (two) times daily.   Yes [provider]  diazepam (VALIUM) 5 MG tablet Take 1  tablet (5 mg total) by mouth daily as needed for anxiety. 08/28/22  Yes Corwin Levins, MD  gabapentin (NEURONTIN) 300 MG capsule Take 300 mg by mouth 3 (three) times daily.   Yes [provider]  rizatriptan (MAXALT) 10 MG tablet TAKE 1 TABLET BY MOUTH AS NEEDED FOR MIGRAINE. MAY REPEAT IN 2 HOURS IF NEEDED. 05/05/22  Yes Corwin Levins, MD  tiZANidine (ZANAFLEX) 4 MG tablet Take 2 mg by mouth 3 (three) times daily as needed. 02/04/22  Yes [provider]  levonorgestrel (MIRENA) 20 MCG/24HR IUD 1 each by Intrauterine route once.     [provider]  traMADol (ULTRAM) 50 MG tablet Take 1 tablet (50 mg total) by mouth every 6 (six) hours as needed for severe pain. Patient not taking: Reported on 06/04/2022 05/15/22   Vernetta Honey, PA-C    ROS: All other systems have been reviewed and were otherwise negative with the exception of those mentioned in the HPI and as above.  Physical Exam: General: Alert, no acute distress Cardiovascular: No pedal edema Respiratory: No cyanosis, no use of accessory musculature GI: No organomegaly, abdomen is soft and non-tender Skin: No lesions in the area of chief complaint Neurologic: Sensation intact distally Psychiatric: Patient is competent for consent with normal mood and affect Lymphatic: No axillary or cervical lymphadenopathy  MUSCULOSKELETAL:  Examination of the right knee is unchanged.  Incisions are benign.  She is tender to palpation about the right knee.  Range of motion from 0 to 110 degrees.    Imaging: Per op note from previous knee arthroscopy on 05/15/2022: "The medial compartment had grade 1 softening throughout the medial femoral condyle but no obvious other pathology. There were innumerable small loose bodies throughout the joint that were removed primarily with a shaver. There was gross full-thickness areas of cartilage loss in the patella though the trochlea was relatively intact. There was full-thickness loss laterally and centrally as well as involving the inferior pole. In total it measured 24 x 16 mm in size. "  Assessment: right patella condral lesion, compartment syndrome, synovitis, cartilage disorder  Plan: Plan for Procedure(s): FASCIOTOMY SYNOVECTOMY KNEE ARTHROSCOPY WITH IMPLANTATION OF MATRIX-INDUCED AUTOLOGOUS CHONDROCYTE (MACI) TIBIAL TUBERCLE TRANSFER  The risks benefits and alternatives were discussed with the patient including but not limited to the risks of nonoperative treatment, versus surgical intervention including infection,  bleeding, nerve injury,  blood clots, cardiopulmonary complications, morbidity, mortality, among others, and they were willing to proceed.   The patient acknowledged the explanation, agreed to proceed with the plan and consent was signed.   Operative Plan: Right knee cartilage transplant with MACI and a tibial tubercle osteotomy Discharge Medications: Tylenol, Celebrex, Gabapentin 300 TID, Robaxin, oxycodone, zofran  DVT Prophylaxis: resume Eliquis Physical Therapy: outpatient PT Special Discharge needs: [place]. [employer]   Vernetta Honey, PA-C  10/01/2022 3:58 PM

## 2022-10-01 NOTE — Discharge Instructions (Addendum)
Ramond Marrow MD, MPH Alfonse Alpers, PA-C [facility] [address], [address] [phone] (tel)   [phone] (fax)   POST-OPERATIVE INSTRUCTIONS  WOUND CARE - You may remove the Operative Dressing on Post-Op Day #3 (72hrs after surgery).   - Alternatively if you would like you can leave dressing on until follow-up if within 7-8 days but keep it dry. - Leave steri-strips in place until they fall off on their own, usually 2 weeks postop. - An ACE wrap may be used to control swelling, do not wrap this too tight.  If the initial ACE wrap feels too tight you may loosen it. - There may be a small amount of fluid/bleeding leaking at the surgical site.  - This is normal; the knee is filled with fluid during the procedure and can leak for 24-48hrs after surgery.  - You may change/reinforce the bandage as needed.  - Use the Cryocuff or Ice as often as possible for the first 7 days, then as needed for pain relief. Always keep a towel, ACE wrap or other barrier between the cooling unit and your skin.  - You may shower on Post-Op Day #3. Gently pat the area dry.  - Do not soak the knee in water or submerge it.  - Do not go swimming in the pool or ocean until 4 weeks after surgery or when otherwise instructed.  Keep incisions as dry as possible.   BRACE/AMBULATION - You will be placed in a brace post-operatively.  - Wear your brace at all times until follow-up.  - You may remove for hygiene. -           Use crutches to help you ambulate -           Touch-down weight bearing: when you stand or walk, you may only touch your toes to the floor for balance -           Do NOT put any body weight on your leg  PHYSICAL THERAPY - You will begin physical therapy soon after surgery (unless otherwise specified) - Please call to set up an appointment, if you do not already have one  - Let our office if there are any issues with scheduling your therapy  - You have a physical therapy  appointment scheduled at [facility] (across the hall from our office) on Monday June 17th  REGIONAL ANESTHESIA (NERVE BLOCKS) The anesthesia team may have performed a nerve block for you this is a great tool used to minimize pain.   The block may start wearing off overnight (between 8-24 hours postop) When the block wears off, your pain may go from nearly zero to the pain you would have had postop without the block. This is an abrupt transition but nothing dangerous is happening.   This can be a challenging period but utilize your as needed pain medications to try and manage this period. We suggest you use the pain medication the first night prior to going to bed, to ease this transition.  You may take an extra dose of narcotic when this happens if needed  POST-OP MEDICATIONS- Multimodal approach to pain control In general your pain will be controlled with a combination of substances.  Prescriptions unless otherwise discussed are electronically sent to your pharmacy.  This is a carefully made plan we use to minimize narcotic use.     Celebrex - Anti-inflammatory medication taken on a scheduled basis Acetaminophen - Non-narcotic pain medicine taken on a scheduled  basis  Gabapentin - this is to help with nerve based pain, take on a scheduled basis Oxycodone - This is a strong narcotic, to be used only on an "as needed" basis for SEVERE pain. Robaxin - this is a muscle relaxer, take as needed for muscle spasms Zofran - take as needed for nausea  - You may resume your Eliquis 24 hours after surgery    FOLLOW-UP   Please call the office to schedule a follow-up appointment for your incision check, 7-10 days post-operatively.  IF YOU HAVE ANY QUESTIONS, PLEASE FEEL FREE TO CALL OUR OFFICE.   HELPFUL INFORMATION    Keep your leg elevated to decrease swelling, which will then in turn decrease your pain. I would elevate the foot of your bed by putting a couple of couch pillows between your  mattress and box spring. I would not keep pillow directly under your ankle.  - Do not sleep with a pillow behind your knee even if it is more comfortable as this may make it harder to get your knee fully straight long term.   There will be MORE swelling on days 1-3 than there is on the day of surgery.  This also is normal. The swelling will decrease with the anti-inflammatory medication, ice and keeping it elevated. The swelling will make it more difficult to bend your knee. As the swelling goes down your motion will become easier   You may develop swelling and bruising that extends from your knee down to your calf and perhaps even to your foot over the next week. Do not be alarmed. This too is normal, and it is due to gravity   There may be some numbness adjacent to the incision site. This may last for 6-12 months or longer in some patients and is expected.   You may return to sedentary work/school in the next couple of days when you feel up to it. You will need to keep your leg elevated as much as possible    You should wean off your narcotic medicines as soon as you are able.  Most patients will be off narcotics before their first postop appointment.    We suggest you use the pain medication the first night prior to going to bed, in order to ease any pain when the anesthesia wears off. You should avoid taking pain medications on an empty stomach as it will make you nauseous.   Do not drink alcoholic beverages or take illicit drugs when taking pain medications.   It is against the law to drive while taking narcotics. You cannot drive if your Right leg is in brace locked in extension.   Pain medication may make you constipated.  Below are a few solutions to try in this order:  o Decrease the amount of pain medication if you aren't having pain.  o Drink lots of decaffeinated fluids.  o Drink prune juice and/or each dried prunes   o If the first 3 don't work start with additional  solutions  o Take Colace - an over-the-counter stool softener  o Take Senokot - an over-the-counter laxative  o Take Miralax - a stronger over-the-counter laxative   For more information including helpful videos and documents visit our website:   [email or web address]   Post Anesthesia Home Care Instructions  Activity: Get plenty of rest for the remainder of the day. A responsible individual must stay with you for 24 hours following the procedure.  For the next 24 hours, DO NOT: -Drive  a car -Advertising copywriter -Drink alcoholic beverages -Take any medication unless instructed by your physician -Make any legal decisions or sign important papers.  Meals: Start with liquid foods such as gelatin or soup. Progress to regular foods as tolerated. Avoid greasy, spicy, heavy foods. If nausea and/or vomiting occur, drink only clear liquids until the nausea and/or vomiting subsides. Call your physician if vomiting continues.  Special Instructions/Symptoms: Your throat may feel dry or sore from the anesthesia or the breathing tube placed in your throat during surgery. If this causes discomfort, gargle with warm salt water. The discomfort should disappear within 24 hours.  Tylenol can be taken after 4:43 pm Mobic given at 10:43 am

## 2022-10-02 ENCOUNTER — Other Ambulatory Visit: Payer: Self-pay

## 2022-10-02 ENCOUNTER — Ambulatory Visit (HOSPITAL_BASED_OUTPATIENT_CLINIC_OR_DEPARTMENT_OTHER)
Admission: RE | Admit: 2022-10-02 | Discharge: 2022-10-02 | Disposition: A | Discharge status: Home / Self Care | Payer: BC Managed Care – PPO | Attending: Orthopaedic Surgery | Admitting: Orthopaedic Surgery

## 2022-10-02 ENCOUNTER — Encounter (HOSPITAL_BASED_OUTPATIENT_CLINIC_OR_DEPARTMENT_OTHER)
Admission: RE | Disposition: A | Discharge status: Home / Self Care | Payer: Self-pay | Source: Home / Self Care | Attending: Orthopaedic Surgery

## 2022-10-02 ENCOUNTER — Encounter (HOSPITAL_BASED_OUTPATIENT_CLINIC_OR_DEPARTMENT_OTHER): Payer: Self-pay | Admitting: Orthopaedic Surgery

## 2022-10-02 ENCOUNTER — Ambulatory Visit (HOSPITAL_BASED_OUTPATIENT_CLINIC_OR_DEPARTMENT_OTHER): Payer: BC Managed Care – PPO | Admitting: Certified Registered"

## 2022-10-02 ENCOUNTER — Ambulatory Visit (HOSPITAL_COMMUNITY): Payer: BC Managed Care – PPO

## 2022-10-02 DIAGNOSIS — Z86718 Personal history of other venous thrombosis and embolism: Secondary | ICD-10-CM | POA: Insufficient documentation

## 2022-10-02 DIAGNOSIS — M2419 Other articular cartilage disorders, other specified site: Secondary | ICD-10-CM | POA: Diagnosis not present

## 2022-10-02 DIAGNOSIS — Z87891 Personal history of nicotine dependence: Secondary | ICD-10-CM | POA: Insufficient documentation

## 2022-10-02 DIAGNOSIS — M228X1 Other disorders of patella, right knee: Secondary | ICD-10-CM | POA: Diagnosis not present

## 2022-10-02 DIAGNOSIS — M1711 Unilateral primary osteoarthritis, right knee: Secondary | ICD-10-CM | POA: Diagnosis not present

## 2022-10-02 DIAGNOSIS — F32A Depression, unspecified: Secondary | ICD-10-CM | POA: Diagnosis not present

## 2022-10-02 DIAGNOSIS — R519 Headache, unspecified: Secondary | ICD-10-CM | POA: Diagnosis not present

## 2022-10-02 DIAGNOSIS — M948X6 Other specified disorders of cartilage, lower leg: Secondary | ICD-10-CM | POA: Insufficient documentation

## 2022-10-02 DIAGNOSIS — M79A21 Nontraumatic compartment syndrome of right lower extremity: Secondary | ICD-10-CM | POA: Diagnosis not present

## 2022-10-02 DIAGNOSIS — G8918 Other acute postprocedural pain: Secondary | ICD-10-CM | POA: Diagnosis not present

## 2022-10-02 DIAGNOSIS — M2241 Chondromalacia patellae, right knee: Secondary | ICD-10-CM | POA: Diagnosis not present

## 2022-10-02 DIAGNOSIS — Z01818 Encounter for other preprocedural examination: Secondary | ICD-10-CM

## 2022-10-02 DIAGNOSIS — Z7901 Long term (current) use of anticoagulants: Secondary | ICD-10-CM | POA: Insufficient documentation

## 2022-10-02 DIAGNOSIS — Z0389 Encounter for observation for other suspected diseases and conditions ruled out: Secondary | ICD-10-CM | POA: Diagnosis not present

## 2022-10-02 HISTORY — PX: SYNOVECTOMY: SHX5180

## 2022-10-02 HISTORY — PX: FASCIOTOMY: SHX132

## 2022-10-02 HISTORY — PX: FULKERSON SLIDE: SHX5018

## 2022-10-02 LAB — POCT PREGNANCY, URINE: Preg Test, Ur: NEGATIVE

## 2022-10-02 SURGERY — FASCIOTOMY, UPPER EXTREMITY
Anesthesia: General | Laterality: Right

## 2022-10-02 MED ORDER — ACETAMINOPHEN 500 MG PO TABS
1000.0000 mg | ORAL_TABLET | Freq: Once | ORAL | Status: AC
  Administered 2022-10-02: 1000 mg via ORAL

## 2022-10-02 MED ORDER — GABAPENTIN 400 MG PO CAPS: 400.0000 mg | ORAL_CAPSULE | Freq: Three times a day (TID) | ORAL | 0 refills | Status: AC

## 2022-10-02 MED ORDER — CEFAZOLIN SODIUM-DEXTROSE 2-4 GM/100ML-% IV SOLN
2.0000 g | INTRAVENOUS | Status: AC
  Administered 2022-10-02: 2 g via INTRAVENOUS

## 2022-10-02 MED ORDER — BUPIVACAINE HCL (PF) 0.25 % IJ SOLN
INTRAMUSCULAR | Status: AC
  Filled 2022-10-02: qty 30

## 2022-10-02 MED ORDER — FENTANYL CITRATE (PF) 100 MCG/2ML IJ SOLN
INTRAMUSCULAR | Status: AC
  Filled 2022-10-02: qty 2

## 2022-10-02 MED ORDER — FENTANYL CITRATE (PF) 100 MCG/2ML IJ SOLN
100.0000 ug | Freq: Once | INTRAMUSCULAR | Status: AC
  Administered 2022-10-02: 100 ug via INTRAVENOUS

## 2022-10-02 MED ORDER — CELECOXIB 200 MG PO CAPS: 200.0000 mg | ORAL_CAPSULE | Freq: Two times a day (BID) | ORAL | 0 refills | Status: AC

## 2022-10-02 MED ORDER — ONDANSETRON HCL 4 MG/2ML IJ SOLN
INTRAMUSCULAR | Status: DC | PRN
  Administered 2022-10-02: 4 mg via INTRAVENOUS

## 2022-10-02 MED ORDER — AMISULPRIDE (ANTIEMETIC) 5 MG/2ML IV SOLN
10.0000 mg | Freq: Once | INTRAVENOUS | Status: AC
  Administered 2022-10-02: 10 mg via INTRAVENOUS

## 2022-10-02 MED ORDER — METHOCARBAMOL 500 MG PO TABS: 500.0000 mg | ORAL_TABLET | Freq: Three times a day (TID) | ORAL | 0 refills | Status: DC | PRN

## 2022-10-02 MED ORDER — ACETAMINOPHEN 500 MG PO TABS
ORAL_TABLET | ORAL | Status: AC
  Filled 2022-10-02: qty 2

## 2022-10-02 MED ORDER — "VISTASEAL 4 ML SINGLE DOSE KIT "
PACK | CUTANEOUS | Status: DC | PRN
  Administered 2022-10-02: 4 mL via TOPICAL

## 2022-10-02 MED ORDER — OXYCODONE HCL 5 MG PO TABS
5.0000 mg | ORAL_TABLET | Freq: Once | ORAL | Status: AC | PRN
  Administered 2022-10-02: 5 mg via ORAL

## 2022-10-02 MED ORDER — ONDANSETRON HCL 4 MG/2ML IJ SOLN
INTRAMUSCULAR | Status: AC
  Filled 2022-10-02: qty 2

## 2022-10-02 MED ORDER — LACTATED RINGERS IV SOLN: INTRAVENOUS | Status: DC

## 2022-10-02 MED ORDER — FENTANYL CITRATE (PF) 100 MCG/2ML IJ SOLN
25.0000 ug | INTRAMUSCULAR | Status: DC | PRN
  Administered 2022-10-02: 50 ug via INTRAVENOUS
  Administered 2022-10-02 (×2): 25 ug via INTRAVENOUS

## 2022-10-02 MED ORDER — LIDOCAINE 2% (20 MG/ML) 5 ML SYRINGE
INTRAMUSCULAR | Status: AC
  Filled 2022-10-02: qty 5

## 2022-10-02 MED ORDER — ONDANSETRON HCL 4 MG PO TABS: 4.0000 mg | ORAL_TABLET | Freq: Three times a day (TID) | ORAL | 0 refills | Status: AC | PRN

## 2022-10-02 MED ORDER — PROPOFOL 10 MG/ML IV BOLUS
INTRAVENOUS | Status: DC | PRN
  Administered 2022-10-02: 200 mg via INTRAVENOUS

## 2022-10-02 MED ORDER — MIDAZOLAM HCL 2 MG/2ML IJ SOLN
2.0000 mg | Freq: Once | INTRAMUSCULAR | Status: AC
  Administered 2022-10-02: 2 mg via INTRAVENOUS

## 2022-10-02 MED ORDER — GABAPENTIN 300 MG PO CAPS
ORAL_CAPSULE | ORAL | Status: AC
  Filled 2022-10-02: qty 1

## 2022-10-02 MED ORDER — GABAPENTIN 300 MG PO CAPS
300.0000 mg | ORAL_CAPSULE | Freq: Once | ORAL | Status: AC
  Administered 2022-10-02: 300 mg via ORAL

## 2022-10-02 MED ORDER — AMISULPRIDE (ANTIEMETIC) 5 MG/2ML IV SOLN
INTRAVENOUS | Status: AC
  Filled 2022-10-02: qty 4

## 2022-10-02 MED ORDER — LIDOCAINE 2% (20 MG/ML) 5 ML SYRINGE
INTRAMUSCULAR | Status: DC | PRN
  Administered 2022-10-02: 50 mg via INTRAVENOUS

## 2022-10-02 MED ORDER — DEXAMETHASONE SODIUM PHOSPHATE 10 MG/ML IJ SOLN
INTRAMUSCULAR | Status: DC | PRN
  Administered 2022-10-02: 5 mg via INTRAVENOUS

## 2022-10-02 MED ORDER — THROMBIN 5000 UNITS EX SOLR
CUTANEOUS | Status: AC
  Filled 2022-10-02: qty 5000

## 2022-10-02 MED ORDER — DEXAMETHASONE SODIUM PHOSPHATE 10 MG/ML IJ SOLN
INTRAMUSCULAR | Status: AC
  Filled 2022-10-02: qty 1

## 2022-10-02 MED ORDER — MELOXICAM 7.5 MG PO TABS
ORAL_TABLET | ORAL | Status: AC
  Filled 2022-10-02: qty 2

## 2022-10-02 MED ORDER — FENTANYL CITRATE (PF) 100 MCG/2ML IJ SOLN
INTRAMUSCULAR | Status: DC | PRN
  Administered 2022-10-02 (×2): 25 ug via INTRAVENOUS
  Administered 2022-10-02: 50 ug via INTRAVENOUS

## 2022-10-02 MED ORDER — ACETAMINOPHEN 500 MG PO TABS: 1000.0000 mg | ORAL_TABLET | Freq: Three times a day (TID) | ORAL | 0 refills | Status: AC

## 2022-10-02 MED ORDER — MELOXICAM 7.5 MG PO TABS
15.0000 mg | ORAL_TABLET | Freq: Once | ORAL | Status: AC
  Administered 2022-10-02: 15 mg via ORAL

## 2022-10-02 MED ORDER — MIDAZOLAM HCL 2 MG/2ML IJ SOLN
INTRAMUSCULAR | Status: AC
  Filled 2022-10-02: qty 2

## 2022-10-02 MED ORDER — CEFAZOLIN SODIUM-DEXTROSE 2-4 GM/100ML-% IV SOLN
INTRAVENOUS | Status: AC
  Filled 2022-10-02: qty 100

## 2022-10-02 MED ORDER — DEXMEDETOMIDINE HCL IN NACL 80 MCG/20ML IV SOLN
INTRAVENOUS | Status: DC | PRN
  Administered 2022-10-02 (×2): 8 ug via INTRAVENOUS

## 2022-10-02 MED ORDER — ONDANSETRON HCL 4 MG/2ML IJ SOLN: 4.0000 mg | Freq: Four times a day (QID) | INTRAMUSCULAR | Status: DC | PRN

## 2022-10-02 MED ORDER — OXYCODONE HCL 5 MG PO TABS: ORAL_TABLET | ORAL | 0 refills | Status: AC

## 2022-10-02 MED ORDER — EPHEDRINE SULFATE (PRESSORS) 50 MG/ML IJ SOLN
INTRAMUSCULAR | Status: DC | PRN
  Administered 2022-10-02 (×2): 10 mg via INTRAVENOUS

## 2022-10-02 MED ORDER — OXYCODONE HCL 5 MG/5ML PO SOLN: 5.0000 mg | Freq: Once | ORAL | Status: AC | PRN

## 2022-10-02 MED ORDER — VANCOMYCIN HCL 1 G IV SOLR
INTRAVENOUS | Status: DC | PRN
  Administered 2022-10-02: 1000 mg via TOPICAL

## 2022-10-02 MED ORDER — SODIUM CHLORIDE 0.9 % IR SOLN
Status: DC | PRN
  Administered 2022-10-02: 1200 mL

## 2022-10-02 MED ORDER — OXYCODONE HCL 5 MG PO TABS
ORAL_TABLET | ORAL | Status: AC
  Filled 2022-10-02: qty 1

## 2022-10-02 MED ORDER — PROPOFOL 10 MG/ML IV BOLUS
INTRAVENOUS | Status: AC
  Filled 2022-10-02: qty 20

## 2022-10-02 MED ORDER — BUPIVACAINE HCL (PF) 0.5 % IJ SOLN
INTRAMUSCULAR | Status: DC | PRN
  Administered 2022-10-02: 25 mL via PERINEURAL

## 2022-10-02 SURGICAL SUPPLY — 116 items
APL PRP STRL LF DISP 70% ISPRP (MISCELLANEOUS) ×1
APL SKNCLS STERI-STRIP NONHPOA (GAUZE/BANDAGES/DRESSINGS) ×1
BANDAGE ESMARK 6X9 LF (GAUZE/BANDAGES/DRESSINGS) IMPLANT
BENZOIN TINCTURE PRP APPL 2/3 (GAUZE/BANDAGES/DRESSINGS) ×1 IMPLANT
BIT DRILL 2.5 CANN LNG (BIT) IMPLANT
BIT DRILL 3.5 CANN STRL (BIT) IMPLANT
BLADE AVERAGE 25X9 (BLADE) ×1 IMPLANT
BLADE HEX COATED 2.75 (ELECTRODE) ×1 IMPLANT
BLADE SHAVER BONE 5.0X13 (MISCELLANEOUS) IMPLANT
BLADE SURG 10 STRL SS (BLADE) ×1 IMPLANT
BLADE SURG 15 STRL LF DISP TIS (BLADE) ×2 IMPLANT
BLADE SURG 15 STRL SS (BLADE) ×2
BNDG CMPR 5X4 CHSV STRCH STRL (GAUZE/BANDAGES/DRESSINGS) ×1
BNDG CMPR 5X4 KNIT ELC UNQ LF (GAUZE/BANDAGES/DRESSINGS)
BNDG CMPR 5X62 HK CLSR LF (GAUZE/BANDAGES/DRESSINGS) ×1
BNDG CMPR 6"X 5 YARDS HK CLSR (GAUZE/BANDAGES/DRESSINGS) ×1
BNDG CMPR 9X6 STRL LF SNTH (GAUZE/BANDAGES/DRESSINGS)
BNDG COHESIVE 4X5 TAN STRL LF (GAUZE/BANDAGES/DRESSINGS) ×1 IMPLANT
BNDG ELASTIC 4INX 5YD STR LF (GAUZE/BANDAGES/DRESSINGS) IMPLANT
BNDG ELASTIC 6INX 5YD STR LF (GAUZE/BANDAGES/DRESSINGS) ×1 IMPLANT
BNDG ESMARK 6X9 LF (GAUZE/BANDAGES/DRESSINGS)
BNDG GAUZE DERMACEA FLUFF 4 (GAUZE/BANDAGES/DRESSINGS) IMPLANT
BNDG GZE DERMACEA 4 6PLY (GAUZE/BANDAGES/DRESSINGS)
BURR OVAL 8 FLU 4.0X13 (MISCELLANEOUS) IMPLANT
CANISTER SUCT 1200ML W/VALVE (MISCELLANEOUS) IMPLANT
CHLORAPREP W/TINT 26 (MISCELLANEOUS) ×1 IMPLANT
CLSR STERI-STRIP ANTIMIC 1/2X4 (GAUZE/BANDAGES/DRESSINGS) ×1 IMPLANT
COOLER ICEMAN CLASSIC (MISCELLANEOUS) ×1 IMPLANT
COUNTERSINK SURGICAL 3.5 4.0 (DRILL) ×1
COVER BACK TABLE 60X90IN (DRAPES) ×1 IMPLANT
CUFF TOURN SGL QUICK 34 (TOURNIQUET CUFF) ×1
CUFF TRNQT CYL 34X4.125X (TOURNIQUET CUFF) ×1 IMPLANT
DISSECTOR 3.5MM X 13CM CVD (MISCELLANEOUS) ×1 IMPLANT
DISSECTOR 4.0MMX13CM CVD (MISCELLANEOUS) ×1 IMPLANT
DRAPE ARTHROSCOPY W/POUCH 90 (DRAPES) ×1 IMPLANT
DRAPE C-ARM 42X72 X-RAY (DRAPES) ×1 IMPLANT
DRAPE C-ARMOR (DRAPES) ×1 IMPLANT
DRAPE EXTREMITY T 121X128X90 (DISPOSABLE) ×1 IMPLANT
DRAPE IMP U-DRAPE 54X76 (DRAPES) ×1 IMPLANT
DRAPE INCISE IOBAN 66X45 STRL (DRAPES) IMPLANT
DRAPE TOP ARMCOVERS (MISCELLANEOUS) ×1 IMPLANT
DRAPE U-SHAPE 47X51 STRL (DRAPES) ×1 IMPLANT
DRSG EMULSION OIL 3X3 NADH (GAUZE/BANDAGES/DRESSINGS) IMPLANT
DRSG MEPILEX POST OP 4X8 (GAUZE/BANDAGES/DRESSINGS) ×1 IMPLANT
ELECT REM PT RETURN 9FT ADLT (ELECTROSURGICAL) ×1
ELECTRODE REM PT RTRN 9FT ADLT (ELECTROSURGICAL) ×1 IMPLANT
FIBER TAPE 2MM (SUTURE) IMPLANT
GAUZE PAD ABD 8X10 STRL (GAUZE/BANDAGES/DRESSINGS) IMPLANT
GAUZE SPONGE 4X4 12PLY STRL (GAUZE/BANDAGES/DRESSINGS) ×2 IMPLANT
GAUZE XEROFORM 1X8 LF (GAUZE/BANDAGES/DRESSINGS) IMPLANT
GLOVE BIO SURGEON STRL SZ 6.5 (GLOVE) ×1 IMPLANT
GLOVE BIOGEL PI IND STRL 6.5 (GLOVE) ×1 IMPLANT
GLOVE BIOGEL PI IND STRL 8 (GLOVE) ×1 IMPLANT
GLOVE ECLIPSE 8.0 STRL XLNG CF (GLOVE) ×2 IMPLANT
GOWN STRL REUS W/ TWL LRG LVL3 (GOWN DISPOSABLE) ×2 IMPLANT
GOWN STRL REUS W/ TWL XL LVL3 (GOWN DISPOSABLE) ×1 IMPLANT
GOWN STRL REUS W/TWL LRG LVL3 (GOWN DISPOSABLE) ×2
GOWN STRL REUS W/TWL XL LVL3 (GOWN DISPOSABLE) ×2 IMPLANT
GUIDEWIRE 1.35MM (WIRE) IMPLANT
IMMOBILIZER KNEE 22 UNIV (SOFTGOODS) IMPLANT
IMMOBILIZER KNEE 24 THIGH 36 (MISCELLANEOUS) IMPLANT
IMMOBILIZER KNEE 24 UNIV (MISCELLANEOUS)
KIT TRANSTIBIAL (DISPOSABLE) ×1 IMPLANT
KIT TURNOVER KIT B (KITS) ×1 IMPLANT
MACI AUTOLOGOUS CELL SCAFFOLD (Tissue) ×1 IMPLANT
MANIFOLD NEPTUNE II (INSTRUMENTS) ×1 IMPLANT
NDL SUT 6 .5 CRC .975X.05 MAYO (NEEDLE) IMPLANT
NEEDLE MAYO TAPER (NEEDLE)
NS IRRIG 1000ML POUR BTL (IV SOLUTION) ×1 IMPLANT
PACK ARTHROSCOPY DSU (CUSTOM PROCEDURE TRAY) ×1 IMPLANT
PACK BASIN DAY SURGERY FS (CUSTOM PROCEDURE TRAY) ×1 IMPLANT
PAD CAST 4YDX4 CTTN HI CHSV (CAST SUPPLIES) IMPLANT
PAD COLD SHLDR WRAP-ON (PAD) ×1 IMPLANT
PADDING CAST COTTON 4X4 STRL (CAST SUPPLIES)
PADDING CAST COTTON 6X4 STRL (CAST SUPPLIES) IMPLANT
PENCIL SMOKE EVACUATOR (MISCELLANEOUS) ×1 IMPLANT
PORT APPOLLO RF 90DEGREE MULTI (SURGICAL WAND) IMPLANT
SCAFFOLD CELL AUTOLOGOUS MACI (Tissue) IMPLANT
SCREW CORT 3.5X40 LP ANKLE (Screw) IMPLANT
SCREW COUNTERSNK SRGCL 3.5 4.0 (DRILL) IMPLANT
SCREW LP CORT 3.5X38 (Screw) IMPLANT
SCREW NON LOCK 3.5X36 ANKLE (Screw) IMPLANT
SHEET MEDIUM DRAPE 40X70 STRL (DRAPES) ×1 IMPLANT
SLEEVE SCD COMPRESS KNEE MED (STOCKING) ×1 IMPLANT
SPIKE FLUID TRANSFER (MISCELLANEOUS) IMPLANT
SPONGE INTESTINAL PEANUT (DISPOSABLE) ×1 IMPLANT
SPONGE T-LAP 18X18 ~~LOC~~+RFID (SPONGE) ×1 IMPLANT
SPONGE T-LAP 4X18 ~~LOC~~+RFID (SPONGE) IMPLANT
SUCTION TUBE FRAZIER 10FR DISP (SUCTIONS) ×1 IMPLANT
SUT FIBERWIRE #2 38 T-5 BLUE (SUTURE)
SUT MNCRL AB 3-0 PS2 18 (SUTURE) IMPLANT
SUT MNCRL AB 4-0 PS2 18 (SUTURE) ×1 IMPLANT
SUT VIC AB 0 CT1 18XCR BRD 8 (SUTURE) IMPLANT
SUT VIC AB 0 CT1 27 (SUTURE) ×1
SUT VIC AB 0 CT1 27XBRD ANBCTR (SUTURE) ×1 IMPLANT
SUT VIC AB 0 CT1 8-18 (SUTURE)
SUT VIC AB 1 CT1 27 (SUTURE) ×2
SUT VIC AB 1 CT1 27XBRD ANBCTR (SUTURE) IMPLANT
SUT VIC AB 2-0 CT1 27 (SUTURE)
SUT VIC AB 2-0 CT1 TAPERPNT 27 (SUTURE) IMPLANT
SUT VIC AB 2-0 SH 27 (SUTURE)
SUT VIC AB 2-0 SH 27XBRD (SUTURE) IMPLANT
SUT VIC AB 3-0 SH 27 (SUTURE) ×1
SUT VIC AB 3-0 SH 27X BRD (SUTURE) ×1 IMPLANT
SUTURE FIBERWR #2 38 T-5 BLUE (SUTURE) IMPLANT
SUTURE TAPE 1.3 FIBERLOP 20 ST (SUTURE) IMPLANT
SUTURETAPE 1.3 FIBERLOOP 20 ST (SUTURE)
SYR BULB EAR ULCER 3OZ GRN STR (SYRINGE) IMPLANT
TAPE CLOTH 3X10 TAN LF (GAUZE/BANDAGES/DRESSINGS) IMPLANT
TOWEL GREEN STERILE FF (TOWEL DISPOSABLE) ×3 IMPLANT
TUBE CONNECTING 20X1/4 (TUBING) ×1 IMPLANT
TUBE SUCTION HIGH CAP CLEAR NV (SUCTIONS) ×1 IMPLANT
TUBING ARTHROSCOPY IRRIG 16FT (MISCELLANEOUS) ×1 IMPLANT
WATER STERILE IRR 1000ML POUR (IV SOLUTION) ×1 IMPLANT
WRAP KNEE MAXI GEL POST OP (GAUZE/BANDAGES/DRESSINGS) IMPLANT
YANKAUER SUCT BULB TIP NO VENT (SUCTIONS) IMPLANT

## 2022-10-02 NOTE — Anesthesia Procedure Notes (Signed)
Anesthesia Regional Block: Adductor canal block   Pre-Anesthetic Checklist: , timeout performed,  Correct Patient, Correct Site, Correct Laterality,  Correct Procedure, Correct Position, site marked,  Risks and benefits discussed,  Surgical consent,  Pre-op evaluation,  At surgeon's request and post-op pain management  Laterality: Right  Prep: chloraprep       Needles:  Injection technique: Single-shot  Needle Type: Echogenic Needle     Needle Length: 9cm  Needle Gauge: 21     Additional Needles:   Narrative:  Start time: 10/02/2022 11:46 AM End time: 10/02/2022 11:52 AM Injection made incrementally with aspirations every 5 mL.  Performed by: Personally  Anesthesiologist: Achille Rich, MD  Additional Notes: Pt tolerated the procedure well.

## 2022-10-02 NOTE — Anesthesia Postprocedure Evaluation (Signed)
Anesthesia Post Note  Patient: Kathy Mccoy  Procedure(s) Performed: FASCIOTOMY (Right) SYNOVECTOMY (Right) KNEE ARTHROSCOPY WITH IMPLANTATION OF MATRIX-INDUCED AUTOLOGOUS CHONDROCYTE (MACI) (Right) TIBIAL TUBERCLE TRANSFER (Right)     Patient location during evaluation: PACU Anesthesia Type: General Level of consciousness: awake Pain management: pain level controlled Vital Signs Assessment: post-procedure vital signs reviewed and stable Respiratory status: spontaneous breathing Cardiovascular status: stable Postop Assessment: no apparent nausea or vomiting Anesthetic complications: no  No notable events documented.  Last Vitals:  Vitals:   10/02/22 1430 10/02/22 1445  BP: 106/62 (!) 105/58  Pulse: 74 74  Resp: 12 13  Temp: (!) 36.4 C   SpO2: 100% 100%    Last Pain:  Vitals:   10/02/22 1445  TempSrc:   PainSc: 0-No pain                 Caren Macadam

## 2022-10-02 NOTE — Progress Notes (Signed)
Assisted Dr. Chaney Malling with right, adductor canal, ultrasound guided block. Side rails up, monitors on throughout procedure. See vital signs in flow sheet. Tolerated Procedure well.

## 2022-10-02 NOTE — Op Note (Signed)
Orthopaedic Surgery Operative Note (CSN: [id number])  Kathy Mccoy  Nov 24, 1980 Date of Surgery: 10/02/2022   Diagnoses:  Right patellar chondral defect and malalignment  Procedure: Right lateral patellar facet MACI implantation Right tibial tubercle osteotomy with anterior medialization 1 cm Right anterior compartment fasciotomy   Operative Finding Successful completion of the planned procedure.  Patient had a atypical appearance of her patellar tendon and tibial tubercle when compared to normal.  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  This made the tibial tubercle portion of the case quite difficult and the piece itself was quite small.  The tubercle transfer was appropriate however the incision had to be much longer than normal secondary to length of patellar tendon itself.  This may have contributed to her condition.  The MACI implantation was relatively routine.  With good fixation and the lesion itself which was shaped like triangular slice of a circle was 2.2 cm at its widest and about 2.5 cm long tapering down.  This encompassed a large part of the lateral facet of the patella.  We performed an osteotomy to offload this area both anteriorly as well as medially.  The patient's options for patellofemoral arthroplasty may be limited as her trochlea was quite preserved with the exception of a small punctate area about 1 x 2 mm on the lateral facet.  Did not feel this required any intervention.  Moving forward with a arthroplasty would also be difficult secondary to patient's lengthy patellar tendon.  That said I do not feel that any further joint preservation options are likely in the patient's future considering her transplantation today.  I have in my staff has on multiple occasions discussed the patient's pain control issues.   She had a unipolar loss of cartilage and was using narcotics to treat her pain.  This was atypical behavior and not normally in scope of the pain related to this lesion.  I voiced my concerns about this multiple times and we discussed at length that refills on narcotics were not indicated in the surgery having done multiple surgeries of this nature each year.  Post-operative plan: The patient will be touchdown weightbearing in a knee immobilizer locked in extension starting CPM and therapy.  The patient will be discharged home.  DVT prophylaxis Xarelto 10 mg/day.   Pain control with PRN pain medication preferring oral medicines.  Follow up plan will be scheduled in approximately 7 days for incision check and XR.  Post-Op Diagnosis: Same Surgeons:Primary: Bjorn Pippin, MD Assistants:Caroline McBane PA-C Location: [facility] OR ROOM 6 Anesthesia: General with regional anesthesia Antibiotics: Ancef 2 g with local vancomycin powder 1 g at the surgical site Tourniquet time:  Total Tourniquet Time Documented: Thigh (Right) - 39 minutes Total: Thigh (Right) - 39 minutes  Estimated Blood Loss: Minimal Complications: None Specimens: None Implants: Implant Name Type Inv. Item Serial No. Manufacturer Lot No. LRB No. Used Action  MACI AUTOLOGOUS CELL SCAFFOLD - [id number] Tissue MACI AUTOLOGOUS CELL SCAFFOLD  VERICEL [id number] Right 1 Implanted  SCREW CORT 3.5X40 LP ANKLE - [id number] Screw SCREW CORT 3.5X40 LP ANKLE  [employer]  Right 1 Implanted  SCREW LP CORT 3.5X38 - [id number] Screw SCREW LP CORT 3.5X38  [employer]  Right 2 Implanted    Indications for Surgery:   Kathy Mccoy is a 42 y.o. 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  Benefits and risks of operative and nonoperative management were discussed prior to surgery with patient/guardian(s) and informed consent form was completed.  Specific risks including infection, need for additional surgery, continued  pain, stiffness, rupture of the extensor mechanism, loss of graft fixation amongst others.   Procedure:   The patient was identified properly. Informed consent was obtained and the surgical site was marked. The patient was taken up to suite where general anesthesia was induced.  The patient was positioned supine on a regular bed.  The right knee was prepped and draped in the usual sterile fashion.  Timeout was performed before the beginning of the case.  Tourniquet was used for the above duration.  Began by placing a scope in the knee and performing a diagnostic arthroscopy.  We confirmed that there is no other obvious findings and that the patellar lesion was still amenable to grafting.  We converted to open.  We palpated the patient's hypoplastic tibial tubercle which was extremely distal about a third of the way down to her ankle.  This meant that her patellar tendon was quite long.  Her skin was quite thin and this needs an extended incision.  Made a 10 cm incision on the anterior medial aspect of the knee.  Went to skin sharp achieving hemostasis in progress raising full-thickness skin flaps.  We opened the peritenon and identified the remnant of the tibial tubercle.  We were able to skeletonize the tubercle lifting off the anterior compartment from the anterior fasciotomy extending our fascial incision far past her incision.  Once is completely elevated the anterior lateral musculature off the area and skeletonized tubercle.  This was quite hypoplastic.  We used K wires to plantar osteotomy informed osteotomy completing the osteotomy and flipping the tubercle fragment proximally.  We extended our anterior lateral parapatellar incisions primarily using the medial parapatellar side and inverting the patella completely.  We are able to identify the cartilage lesion at this point.  We used curettes to clear and make well shouldered walls.  We then scraped the eburnated bone to ensure that there was  appropriate bleeding surface.  Once this was complete we then obtained our MACI graft  As indicated in the instructions we placed the graft into a basin on the back table.  We cut and shaped and molded to the appropriate size.  We then transferred this measurement onto her graft itself being careful to handle the cellular side.  We then dried and prepared our bony area.  We irrigated prior to this.  Once we were ready with transient placed Tisseel fibrin glue carefully in the base and placed our graft in place.  We waited for a few minutes and then placed a rim of to seal on the edge of the graft and sealed in place.  Waited the obligatory 3 minutes once it was appropriately sealed we tested that it was stable.  We then proceeded to repair osteotomy.  The tubercle was moved about 1 cm medial.  Once was complete we placed three 3.5 screws by technique.  A distal 2 screws did not obtain significant purchase initially and switch them to 38 mm screws instead of 36 and the obtain better purchase.  Final construct was stable.  We irrigated the superficial tissues again after obtaining final fluoroscopic imaging placed local vancomycin powder and closed the incision in multilayer fashion finishing with absorbable suture.  Sterile dressing was placed.  Patient awoken taken  to PACU in stable condition.   Alfonse Alpers, PA-C, present and scrubbed throughout the case, critical for completion in a timely fashion, and for retraction, instrumentation, closure.

## 2022-10-02 NOTE — Anesthesia Preprocedure Evaluation (Signed)
Anesthesia Evaluation  Patient identified by MRN, date of birth, ID band Patient awake    Reviewed: Allergy & Precautions, H&P , NPO status , Patient's Chart, lab work & pertinent test results  Airway Mallampati: II   Neck ROM: full    Dental   Pulmonary former smoker   breath sounds clear to auscultation       Cardiovascular + DVT   Rhythm:regular Rate:Normal  Pt takes Eliquis   Neuro/Psych  Headaches PSYCHIATRIC DISORDERS  Depression       GI/Hepatic   Endo/Other    Renal/GU      Musculoskeletal   Abdominal   Peds  Hematology   Anesthesia Other Findings   Reproductive/Obstetrics                             Anesthesia Physical Anesthesia Plan  ASA: 2  Anesthesia Plan: General   Post-op Pain Management: Regional block*   Induction: Intravenous  PONV Risk Score and Plan: 3 and Ondansetron, Dexamethasone, Midazolam and Treatment may vary due to age or medical condition  Airway Management Planned: LMA  Additional Equipment:   Intra-op Plan:   Post-operative Plan: Extubation in OR  Informed Consent: I have reviewed the patients History and Physical, chart, labs and discussed the procedure including the risks, benefits and alternatives for the proposed anesthesia with the patient or authorized representative who has indicated his/her understanding and acceptance.     Dental advisory given  Plan Discussed with: CRNA, Anesthesiologist and Surgeon  Anesthesia Plan Comments:        Anesthesia Quick Evaluation

## 2022-10-02 NOTE — Transfer of Care (Signed)
Immediate Anesthesia Transfer of Care Note  Patient: Kathy Mccoy  Procedure(s) Performed: FASCIOTOMY (Right) SYNOVECTOMY (Right) KNEE ARTHROSCOPY WITH IMPLANTATION OF MATRIX-INDUCED AUTOLOGOUS CHONDROCYTE (MACI) (Right) TIBIAL TUBERCLE TRANSFER (Right)  Patient Location: PACU  Anesthesia Type:GA combined with regional for post-op pain  Level of Consciousness: sedated  Airway & Oxygen Therapy: Patient Spontanous Breathing and Patient connected to face mask oxygen  Post-op Assessment: Report given to RN and Post -op Vital signs reviewed and stable  Post vital signs: Reviewed and stable  Last Vitals:  Vitals Value Taken Time  BP 106/62 10/02/22 1430  Temp 36.4 C 10/02/22 1430  Pulse 75 10/02/22 1432  Resp 12 10/02/22 1432  SpO2 100 % 10/02/22 1432  Vitals shown include unvalidated device data.  Last Pain:  Vitals:   10/02/22 1035  TempSrc: Oral  PainSc: 0-No pain         Complications: No notable events documented.

## 2022-10-02 NOTE — Anesthesia Procedure Notes (Signed)
Procedure Name: LMA Insertion Date/Time: 10/02/2022 12:32 PM  Performed by: Burna Cash, CRNAPre-anesthesia Checklist: Patient identified, Emergency Drugs available, Suction available and Patient being monitored Patient Re-evaluated:Patient Re-evaluated prior to induction Oxygen Delivery Method: Circle system utilized Preoxygenation: Pre-oxygenation with 100% oxygen Induction Type: IV induction Ventilation: Mask ventilation without difficulty LMA: LMA inserted LMA Size: 4.0 Number of attempts: 1 Airway Equipment and Method: Bite block Placement Confirmation: positive ETCO2 Tube secured with: Tape Dental Injury: Teeth and Oropharynx as per pre-operative assessment

## 2022-10-02 NOTE — Interval H&P Note (Signed)
All questions answered, patient wants to proceed with procedure. ? ?

## 2022-10-03 ENCOUNTER — Encounter (HOSPITAL_BASED_OUTPATIENT_CLINIC_OR_DEPARTMENT_OTHER): Payer: Self-pay | Admitting: Orthopaedic Surgery

## 2022-10-06 DIAGNOSIS — M949 Disorder of cartilage, unspecified: Secondary | ICD-10-CM | POA: Diagnosis not present

## 2022-10-06 DIAGNOSIS — M222X1 Patellofemoral disorders, right knee: Secondary | ICD-10-CM | POA: Diagnosis not present

## 2022-10-09 DIAGNOSIS — M222X1 Patellofemoral disorders, right knee: Secondary | ICD-10-CM | POA: Diagnosis not present

## 2022-10-16 DIAGNOSIS — M949 Disorder of cartilage, unspecified: Secondary | ICD-10-CM | POA: Diagnosis not present

## 2022-10-16 DIAGNOSIS — M222X1 Patellofemoral disorders, right knee: Secondary | ICD-10-CM | POA: Diagnosis not present

## 2022-10-20 DIAGNOSIS — M949 Disorder of cartilage, unspecified: Secondary | ICD-10-CM | POA: Diagnosis not present

## 2022-10-20 DIAGNOSIS — M222X1 Patellofemoral disorders, right knee: Secondary | ICD-10-CM | POA: Diagnosis not present

## 2022-10-21 ENCOUNTER — Emergency Department (HOSPITAL_BASED_OUTPATIENT_CLINIC_OR_DEPARTMENT_OTHER): Payer: BC Managed Care – PPO

## 2022-10-21 ENCOUNTER — Other Ambulatory Visit (HOSPITAL_BASED_OUTPATIENT_CLINIC_OR_DEPARTMENT_OTHER): Payer: Self-pay

## 2022-10-21 ENCOUNTER — Emergency Department (HOSPITAL_BASED_OUTPATIENT_CLINIC_OR_DEPARTMENT_OTHER)
Admission: EM | Admit: 2022-10-21 | Discharge: 2022-10-21 | Disposition: A | Discharge status: Home / Self Care | Payer: BC Managed Care – PPO | Attending: Emergency Medicine | Admitting: Emergency Medicine

## 2022-10-21 ENCOUNTER — Encounter (HOSPITAL_BASED_OUTPATIENT_CLINIC_OR_DEPARTMENT_OTHER): Payer: Self-pay | Admitting: Emergency Medicine

## 2022-10-21 ENCOUNTER — Other Ambulatory Visit: Payer: Self-pay

## 2022-10-21 DIAGNOSIS — G8918 Other acute postprocedural pain: Secondary | ICD-10-CM | POA: Diagnosis not present

## 2022-10-21 DIAGNOSIS — M79604 Pain in right leg: Secondary | ICD-10-CM | POA: Diagnosis not present

## 2022-10-21 DIAGNOSIS — M79661 Pain in right lower leg: Secondary | ICD-10-CM | POA: Diagnosis not present

## 2022-10-21 DIAGNOSIS — M7989 Other specified soft tissue disorders: Secondary | ICD-10-CM | POA: Diagnosis not present

## 2022-10-21 MED ORDER — OXYCODONE-ACETAMINOPHEN 5-325 MG PO TABS
1.0000 | ORAL_TABLET | Freq: Once | ORAL | Status: AC
  Administered 2022-10-21: 1 via ORAL
  Filled 2022-10-21: qty 1

## 2022-10-21 MED ORDER — OXYCODONE-ACETAMINOPHEN 5-325 MG PO TABS: 1.0000 | ORAL_TABLET | Freq: Four times a day (QID) | ORAL | 0 refills | Status: DC | PRN

## 2022-10-21 NOTE — ED Provider Notes (Signed)
  EMERGENCY DEPARTMENT AT [facility] Provider Note   CSN: [id number] Arrival date & time: 10/21/22  1191     History Chief Complaint  Patient presents with   Leg Pain    Kathy Mccoy is a 42 y.o. female. Patient presents to the ED with concerns of right leg pain.  Patient diagnosed with a DVT on 05/19/2022 and has been on Eliquis.  She did also recently have orthopedic surgery of the right leg/knee including right lateral patellar facet MACI implantation, right tibial tubercle osteotomy with anterior medialization, right anterior compartment fasciotomy.  Is currently on Eliquis once again.  Has followed up with orthopedics once since operative procedure was performed and is currently in PT. Has also had right leg in a knee immobilizer. States that she woke up this morning with significant pain in the right calf.   Leg Pain      Home Medications Prior to Admission medications   Medication Sig Start Date End Date Taking? Authorizing Provider  oxyCODONE-acetaminophen (PERCOCET/ROXICET) 5-325 MG tablet Take 1 tablet by mouth every 6 (six) hours as needed for severe pain. 10/21/22  Yes Kathy Knudsen, PA-C  apixaban (ELIQUIS) 5 MG TABS tablet Take 1 tablet (5 mg total) by mouth 2 (two) times daily. 06/04/22   Kathy Levins, MD  diazepam (VALIUM) 5 MG tablet Take 1 tablet (5 mg total) by mouth daily as needed for anxiety. 08/28/22   Kathy Levins, MD  gabapentin (NEURONTIN) 400 MG capsule Take 1 capsule (400 mg total) by mouth 3 (three) times daily. For pain 10/02/22 11/01/22  Kathy Honey, PA-C  levonorgestrel (MIRENA) 20 MCG/24HR IUD 1 each by Intrauterine route once.    [provider]  methocarbamol (ROBAXIN) 500 MG tablet Take 1 tablet (500 mg total) by mouth every 8 (eight) hours as needed for muscle spasms. 10/02/22   McBane, Jerald Kief, PA-C  rizatriptan (MAXALT) 10 MG tablet TAKE 1 TABLET BY MOUTH AS NEEDED FOR MIGRAINE. MAY REPEAT IN 2 HOURS IF NEEDED.  05/05/22   Kathy Levins, MD      Allergies    Moxifloxacin    Review of Systems   Review of Systems  Musculoskeletal:        Right calf pain  Skin:  Positive for color change.  All other systems reviewed and are negative.   Physical Exam Updated Vital Signs BP 116/64 (BP Location: Right Arm)   Pulse 78   Temp 98.2 F (36.8 C) (Oral)   Resp 16   Ht 5\' 7"  (1.702 m)   Wt 49.9 kg   LMP  (LMP Unknown)   SpO2 100%   BMI 17.23 kg/m  Physical Exam Vitals and nursing note reviewed.  Constitutional:      General: She is not in acute distress.    Appearance: She is well-developed.  HENT:     Head: Normocephalic and atraumatic.  Eyes:     Conjunctiva/sclera: Conjunctivae normal.  Cardiovascular:     Rate and Rhythm: Normal rate and regular rhythm.     Pulses:          Dorsalis pedis pulses are 2+ on the right side and 2+ on the left side.       Posterior tibial pulses are 1+ on the right side and 2+ on the left side.     Heart sounds: No murmur heard.    Comments: Confirmed presence of pulse using doppler in right PT and confirmed with bedside HR monitor.  Pulmonary:     Effort: Pulmonary effort is normal. No respiratory distress.     Breath sounds: Normal breath sounds.  Abdominal:     Palpations: Abdomen is soft.     Tenderness: There is no abdominal tenderness.  Musculoskeletal:        General: Tenderness present. No swelling, deformity or signs of injury.     Cervical back: Neck supple.     Right lower leg: No edema.     Left lower leg: No edema.  Skin:    General: Skin is cool and dry.     Capillary Refill: Capillary refill takes less than 2 seconds.     Coloration: Skin is not pale.     Findings: Bruising present.     Comments: Bruise appearance of the right lower extremity likely due to postoperative recovery.  Neurological:     Mental Status: She is alert.  Psychiatric:        Mood and Affect: Mood normal.     ED Results / Procedures / Treatments    Labs (all labs ordered are listed, but only abnormal results are displayed) Labs Reviewed - No data to display  EKG None  Radiology US Venous Img Lower Right (DVT Study)  Result Date: 10/21/2022 CLINICAL DATA:  Right calf swelling Recently postop EXAM: RIGHT LOWER EXTREMITY VENOUS DOPPLER ULTRASOUND TECHNIQUE: Gray-scale sonography with compression, as well as color and duplex ultrasound, were performed to evaluate the deep venous system(s) from the level of the common femoral vein through the popliteal and proximal calf veins. COMPARISON:  05/19/2022 FINDINGS: VENOUS Normal compressibility of the common femoral, superficial femoral, and popliteal veins, as well as the visualized calf veins. Visualized portions of profunda femoral vein and great saphenous vein unremarkable. No filling defects to suggest DVT on grayscale or color Doppler imaging. Doppler waveforms show normal direction of venous flow, normal respiratory plasticity and response to augmentation. Limited views of the contralateral common femoral vein are unremarkable. OTHER None. Limitations: none IMPRESSION: No right lower extremity DVT. Electronically Signed   By: Acquanetta Belling M.D.   On: 10/21/2022 10:33    Procedures Procedures   Medications Ordered in ED Medications  oxyCODONE-acetaminophen (PERCOCET/ROXICET) 5-325 MG per tablet 1 tablet (1 tablet Oral Given 10/21/22 0940)    ED Course/ Medical Decision Making/ A&P                           Medical Decision Making Risk Prescription drug management.   This patient presents to the ED for concern of leg pain.  Differential diagnosis includes DVT, cellulitis, acute arterial ischemia, claudication   Imaging Studies ordered:  I ordered imaging studies including ultrasound right lower extremity I independently visualized and interpreted imaging which showed no evidence of DVT I agree with the radiologist interpretation   Medicines ordered and prescription drug  management:  I ordered medication including Percocet for pain Reevaluation of the patient after these medicines showed that the patient improved I have reviewed the patients home medicines and have made adjustments as needed   Problem List / ED Course:  Patient presented to the emergency department complaints of right lower leg pain.  Recently had orthopedic surgery on this leg.  Patient was recently diagnosed with a DVT in January 2024 and has been on anticoagulants since then.  Patient is still currently on anticoagulation.  Concerned due to the pain that she has been experiencing but states that she has not had  any recent increase in physical activity and is still making slow progress with physical therapy.  On initial evaluation patient, the right leg does appear to be somewhat discolored but there does appear to be notable bruising present around the right posterior leg at the calf with bruising extending distally towards the plantar aspect of the right foot.  No obvious discoloration in the toes or duskiness was seen.  Some concern for possible ischemic limb but more likely DVT given patient's symptoms.  Dorsalis pedis and posterior tibial pulses are palpated on examination on our near symmetric and equal compared to the left lower extremity.  The posterior tibial pulse on the right leg is potentially a little bit weaker based on palpation.  Good pulses noted on Doppler without any evident bruit present. Will evaluate with ultrasound. Ultrasound negative for any signs of DVT.  Low concern at this point for ischemic limb and send advised patient that she should follow-up with orthopedics for further evaluation as this appears to be postoperative pain and swelling due to physical therapy.  Will prescribe a short course of Percocet for added pain control at home.  Advised patient to return the emergency department if she has any acute worsening of her symptoms or begins to experience discoloration in  the toes of the right foot.  Encourage patient to reintroduce activity slowly as it appears that patient has been aggressive with bearing weight on right leg and attempting to ambulate at home outside of physical therapy sessions.  Patient is agreeable to treatment plan at this time and verbalized understanding all return precautions.  All questions answered prior to patient discharge.  Final Clinical Impression(s) / ED Diagnoses Final diagnoses:  Post-op pain  Right leg pain    Rx / DC Orders ED Discharge Orders          Ordered    oxyCODONE-acetaminophen (PERCOCET/ROXICET) 5-325 MG tablet  Every 6 hours PRN        10/21/22 1120              Salomon Mast 10/21/22 1137    Benjiman Core, MD 10/22/22 1316

## 2022-10-21 NOTE — Discharge Instructions (Addendum)
You were seen in the emergency department for leg pain.  Your ultrasound was negative for any signs of blood clots in the lower extremity.  I have sent in a short refill of your Percocet to your pharmacy that you may take for continued pain control at home.  Please take this only for more severe pain and for mild pain you can take Tylenol or ibuprofen as tolerated.  If you feel that your symptoms are worsening, please return to the emergency department.  Please follow-up with your orthopedic surgery group sooner than currently planned.

## 2022-10-21 NOTE — ED Notes (Signed)
Discharge paperwork given and verbally understood. 

## 2022-10-21 NOTE — ED Triage Notes (Signed)
Pt arrives pov with crutches, c/o RT calf pain today, surgery x 3 weeks pta. Reports RT foot feels cold. Denies shob

## 2022-10-21 NOTE — ED Notes (Signed)
PA assessed whether pulses were found in the foot.Marland KitchenMarland Kitchen

## 2022-10-22 DIAGNOSIS — M949 Disorder of cartilage, unspecified: Secondary | ICD-10-CM | POA: Diagnosis not present

## 2022-10-22 DIAGNOSIS — M222X1 Patellofemoral disorders, right knee: Secondary | ICD-10-CM | POA: Diagnosis not present

## 2022-10-27 DIAGNOSIS — M222X1 Patellofemoral disorders, right knee: Secondary | ICD-10-CM | POA: Diagnosis not present

## 2022-10-27 DIAGNOSIS — M949 Disorder of cartilage, unspecified: Secondary | ICD-10-CM | POA: Diagnosis not present

## 2022-11-03 DIAGNOSIS — M949 Disorder of cartilage, unspecified: Secondary | ICD-10-CM | POA: Diagnosis not present

## 2022-11-03 DIAGNOSIS — M222X1 Patellofemoral disorders, right knee: Secondary | ICD-10-CM | POA: Diagnosis not present

## 2022-11-07 DIAGNOSIS — M949 Disorder of cartilage, unspecified: Secondary | ICD-10-CM | POA: Diagnosis not present

## 2022-11-07 DIAGNOSIS — M222X1 Patellofemoral disorders, right knee: Secondary | ICD-10-CM | POA: Diagnosis not present

## 2022-11-11 DIAGNOSIS — M222X1 Patellofemoral disorders, right knee: Secondary | ICD-10-CM | POA: Diagnosis not present

## 2022-11-11 DIAGNOSIS — M949 Disorder of cartilage, unspecified: Secondary | ICD-10-CM | POA: Diagnosis not present

## 2022-11-12 ENCOUNTER — Telehealth: Payer: Self-pay | Admitting: Internal Medicine

## 2022-11-12 NOTE — Telephone Encounter (Signed)
Forwarding msg to Print production planner for assistance...Raechel Chute

## 2022-11-12 NOTE — Telephone Encounter (Signed)
Pt called stating she just got a bill from [facility] stating that her surgery/ blood back in February 14 th stating that it wasn't medical necessary asking pt to reach out to her provider. I wasn't sure if we needed to send something over to [facility] to handle this or billing. Please advise.

## 2022-11-13 DIAGNOSIS — M222X1 Patellofemoral disorders, right knee: Secondary | ICD-10-CM | POA: Diagnosis not present

## 2022-11-13 DIAGNOSIS — M949 Disorder of cartilage, unspecified: Secondary | ICD-10-CM | POA: Diagnosis not present

## 2022-11-17 DIAGNOSIS — M222X1 Patellofemoral disorders, right knee: Secondary | ICD-10-CM | POA: Diagnosis not present

## 2022-11-17 DIAGNOSIS — M949 Disorder of cartilage, unspecified: Secondary | ICD-10-CM | POA: Diagnosis not present

## 2022-11-19 ENCOUNTER — Encounter (INDEPENDENT_AMBULATORY_CARE_PROVIDER_SITE_OTHER): Payer: Self-pay

## 2022-11-19 ENCOUNTER — Telehealth: Payer: Self-pay | Admitting: Internal Medicine

## 2022-11-19 NOTE — Telephone Encounter (Signed)
Pt stating she need a called back to speak with the nurse about a letter of necessity from blood on feb 14. Please advise.

## 2022-11-20 ENCOUNTER — Encounter: Payer: Self-pay | Admitting: Internal Medicine

## 2022-11-20 DIAGNOSIS — M949 Disorder of cartilage, unspecified: Secondary | ICD-10-CM | POA: Diagnosis not present

## 2022-11-20 DIAGNOSIS — M222X1 Patellofemoral disorders, right knee: Secondary | ICD-10-CM | POA: Diagnosis not present

## 2022-11-20 NOTE — Telephone Encounter (Signed)
Ok this was done to patient by [employer]

## 2022-11-20 NOTE — Telephone Encounter (Signed)
Patient insurance needs a letter stating the reasoning for why the blood work was done for her blood clots so she dosent have to pay the lab bill they issued to her

## 2022-12-01 DIAGNOSIS — M949 Disorder of cartilage, unspecified: Secondary | ICD-10-CM | POA: Diagnosis not present

## 2022-12-01 DIAGNOSIS — M222X1 Patellofemoral disorders, right knee: Secondary | ICD-10-CM | POA: Diagnosis not present

## 2022-12-03 ENCOUNTER — Ambulatory Visit: Payer: BC Managed Care – PPO | Admitting: Internal Medicine

## 2022-12-03 VITALS — BP 98/66 | HR 63 | Temp 98.0°F | Ht 67.0 in | Wt 122.0 lb

## 2022-12-03 DIAGNOSIS — M545 Low back pain, unspecified: Secondary | ICD-10-CM

## 2022-12-03 DIAGNOSIS — M949 Disorder of cartilage, unspecified: Secondary | ICD-10-CM | POA: Diagnosis not present

## 2022-12-03 DIAGNOSIS — E7800 Pure hypercholesterolemia, unspecified: Secondary | ICD-10-CM

## 2022-12-03 DIAGNOSIS — I82401 Acute embolism and thrombosis of unspecified deep veins of right lower extremity: Secondary | ICD-10-CM | POA: Diagnosis not present

## 2022-12-03 DIAGNOSIS — G8929 Other chronic pain: Secondary | ICD-10-CM

## 2022-12-03 DIAGNOSIS — E538 Deficiency of other specified B group vitamins: Secondary | ICD-10-CM | POA: Diagnosis not present

## 2022-12-03 DIAGNOSIS — Z0001 Encounter for general adult medical examination with abnormal findings: Secondary | ICD-10-CM

## 2022-12-03 DIAGNOSIS — R739 Hyperglycemia, unspecified: Secondary | ICD-10-CM | POA: Diagnosis not present

## 2022-12-03 DIAGNOSIS — Z0000 Encounter for general adult medical examination without abnormal findings: Secondary | ICD-10-CM | POA: Diagnosis not present

## 2022-12-03 DIAGNOSIS — M222X1 Patellofemoral disorders, right knee: Secondary | ICD-10-CM | POA: Diagnosis not present

## 2022-12-03 DIAGNOSIS — E559 Vitamin D deficiency, unspecified: Secondary | ICD-10-CM

## 2022-12-03 DIAGNOSIS — M25561 Pain in right knee: Secondary | ICD-10-CM | POA: Diagnosis not present

## 2022-12-03 LAB — URINALYSIS, ROUTINE W REFLEX MICROSCOPIC
Bilirubin Urine: NEGATIVE
Hgb urine dipstick: NEGATIVE
Ketones, ur: NEGATIVE
Leukocytes,Ua: NEGATIVE
Nitrite: NEGATIVE
Specific Gravity, Urine: 1.02 (ref 1.000–1.030)
Total Protein, Urine: NEGATIVE
Urine Glucose: NEGATIVE
Urobilinogen, UA: 0.2 (ref 0.0–1.0)
pH: 7.5 (ref 5.0–8.0)

## 2022-12-03 LAB — BASIC METABOLIC PANEL
BUN: 10 mg/dL (ref 6–23)
CO2: 28 mEq/L (ref 19–32)
Calcium: 9.5 mg/dL (ref 8.4–10.5)
Chloride: 102 mEq/L (ref 96–112)
Creatinine, Ser: 0.77 mg/dL (ref 0.40–1.20)
GFR: 96.37 mL/min (ref >60.00)
Glucose, Bld: 83 mg/dL (ref 70–99)
Potassium: 3.9 mEq/L (ref 3.5–5.1)
Sodium: 136 mEq/L (ref 135–145)

## 2022-12-03 LAB — HEPATIC FUNCTION PANEL
ALT: 15 U/L (ref 0–35)
AST: 20 U/L (ref 0–37)
Albumin: 4.7 g/dL (ref 3.5–5.2)
Alkaline Phosphatase: 46 U/L (ref 39–117)
Bilirubin, Direct: 0.2 mg/dL (ref 0.0–0.3)
Total Bilirubin: 0.9 mg/dL (ref 0.2–1.2)
Total Protein: 7 g/dL (ref 6.0–8.3)

## 2022-12-03 LAB — CBC WITH DIFFERENTIAL/PLATELET
Basophils Absolute: 0 10*3/uL (ref 0.0–0.1)
Basophils Relative: 1 % (ref 0.0–3.0)
Eosinophils Absolute: 0 10*3/uL (ref 0.0–0.7)
Eosinophils Relative: 0.9 % (ref 0.0–5.0)
HCT: 43.6 % (ref 36.0–46.0)
Hemoglobin: 14.4 g/dL (ref 12.0–15.0)
Lymphocytes Relative: 30.9 % (ref 12.0–46.0)
Lymphs Abs: 1.5 10*3/uL (ref 0.7–4.0)
MCHC: 32.9 g/dL (ref 30.0–36.0)
MCV: 100.8 fl — ABNORMAL HIGH (ref 78.0–100.0)
Monocytes Absolute: 0.4 10*3/uL (ref 0.1–1.0)
Monocytes Relative: 8.5 % (ref 3.0–12.0)
Neutro Abs: 2.9 10*3/uL (ref 1.4–7.7)
Neutrophils Relative %: 58.7 % (ref 43.0–77.0)
Platelets: 202 10*3/uL (ref 150.0–400.0)
RBC: 4.32 Mil/uL (ref 3.87–5.11)
RDW: 12.7 % (ref 11.5–15.5)
WBC: 5 10*3/uL (ref 4.0–10.5)

## 2022-12-03 LAB — VITAMIN B12: Vitamin B-12: 256 pg/mL (ref 211–911)

## 2022-12-03 LAB — LIPID PANEL
Cholesterol: 172 mg/dL (ref 0–200)
HDL: 73.4 mg/dL (ref >39.00)
LDL Cholesterol: 85 mg/dL (ref 0–99)
NonHDL: 98.46
Total CHOL/HDL Ratio: 2
Triglycerides: 65 mg/dL (ref 0.0–149.0)
VLDL: 13 mg/dL (ref 0.0–40.0)

## 2022-12-03 LAB — HEMOGLOBIN A1C: Hgb A1c MFr Bld: 5.1 % (ref 4.6–6.5)

## 2022-12-03 LAB — TSH: TSH: 1.17 u[IU]/mL (ref 0.35–5.50)

## 2022-12-03 LAB — VITAMIN D 25 HYDROXY (VIT D DEFICIENCY, FRACTURES): VITD: 43.01 ng/mL (ref 30.00–100.00)

## 2022-12-03 MED ORDER — BETAMETHASONE VALERATE 0.12 % EX FOAM: 1.0000 | Freq: Two times a day (BID) | CUTANEOUS | 3 refills | Status: AC

## 2022-12-03 NOTE — Progress Notes (Signed)
Patient ID: Kathy Mccoy, female   DOB: Feb 27, 1981, 42 y.o.   MRN: [id number]         Chief Complaint:: wellness exam and hx of DVT, right lower back pain       HPI:  Kathy Mccoy is a 42 y.o. female here for wellness exam; decliens covid booster, for flu shot at pharmacy, o/w up to date                        Also has hx of DVT earlier this yr, now finished eliquis course. S/p knee surgury x 2, last one 6 wks ago and oding well.  Pt required the hypercoagulable panel to determine at this point if pt has other hypercoagulable condition that would require permanent eliquis use but as this was negative, pt is now eligible to stop eliquis.  Pt continues to have recurring right LBP at the SI joint area in the past 3 mo without bowel or bladder change, fever, wt loss,  worsening LE pain/numbness/weakness, gait change or falls, plans to f/u ortho for this as well as right knee.     Wt Readings from Last 3 Encounters:  12/03/22 122 lb (55.3 kg)  10/21/22 110 lb (49.9 kg)  10/02/22 113 lb 1.5 oz (51.3 kg)   BP Readings from Last 3 Encounters:  12/03/22 98/66  10/21/22 116/64  10/02/22 122/74   Immunization History  Administered Date(s) Administered   Tdap 01/25/2018   Health Maintenance Due  Topic Date Due   HIV Screening  Never done      Past Medical History:  Diagnosis Date   Acute pharyngitis 03/23/2010   ANXIETY DEPRESSION 01/02/2009   CARDIAC ARRHYTHMIA 01/12/2009   COMMON MIGRAINE 01/02/2009   POTS (postural orthostatic tachycardia syndrome)    PREMATURE VENTRICULAR CONTRACTIONS 02/21/2009   SINUSITIS- ACUTE-NOS 08/15/2009   SYNCOPE 01/02/2009   Past Surgical History:  Procedure Laterality Date   CHONDROPLASTY Right 05/15/2022   Procedure: CHONDROPLASTY, AND CARTILAGE BIOPSY;  Surgeon: Bjorn Pippin, MD;  Location: [facility];  Service: Orthopedics;  Laterality: Right;   FASCIOTOMY Right 10/02/2022   Procedure: FASCIOTOMY;  Surgeon: Bjorn Pippin, MD;   Location: [facility];  Service: Orthopedics;  Laterality: Right;   FULKERSON SLIDE Right 10/02/2022   Procedure: TIBIAL TUBERCLE TRANSFER;  Surgeon: Bjorn Pippin, MD;  Location: [facility];  Service: Orthopedics;  Laterality: Right;   KNEE ARTHROSCOPY     KNEE ARTHROSCOPY Right 05/15/2022   Procedure: ARTHROSCOPY KNEE/diagnostic, lLOOSE BODY EXCISION;  Surgeon: Bjorn Pippin, MD;  Location: [facility];  Service: Orthopedics;  Laterality: Right;   s/p rhinoplasty  04/22/2007   SYNOVECTOMY Right 10/02/2022   Procedure: SYNOVECTOMY;  Surgeon: Bjorn Pippin, MD;  Location:  SURGERY CENTER;  Service: Orthopedics;  Laterality: Right;    reports that she quit smoking about 5 years ago. Her smoking use included cigarettes. She has never used smokeless tobacco. She reports current alcohol use. She reports that she does not currently use drugs after having used the following drugs: Marijuana. family history includes Breast cancer in her maternal grandmother; Hyperlipidemia in her father. Allergies  Allergen Reactions   Moxifloxacin Rash and Hives   Current Outpatient Medications on File Prior to Visit  Medication Sig Dispense Refill   celecoxib (CELEBREX) 200 MG capsule Take 200 mg by mouth 2 (two) times daily.     diazepam (VALIUM) 5 MG tablet Take 1  tablet (5 mg total) by mouth daily as needed for anxiety. 30 tablet 2   levonorgestrel (MIRENA) 20 MCG/24HR IUD 1 each by Intrauterine route once.     oxyCODONE-acetaminophen (PERCOCET/ROXICET) 5-325 MG tablet Take 1 tablet by mouth every 6 (six) hours as needed for severe pain. 10 tablet 0   rizatriptan (MAXALT) 10 MG tablet TAKE 1 TABLET BY MOUTH AS NEEDED FOR MIGRAINE. MAY REPEAT IN 2 HOURS IF NEEDED. 10 tablet 11   No current facility-administered medications on file prior to visit.        ROS:  All others reviewed and negative.  Objective        PE:  BP 98/66 (BP Location: Left Arm, Patient  Position: Sitting, Cuff Size: Normal)   Pulse 63   Temp 98 F (36.7 C) (Oral)   Ht 5\' 7"  (1.702 m)   Wt 122 lb (55.3 kg)   BMI 19.11 kg/m                 Constitutional: Pt appears in NAD               HENT: Head: NCAT.                Right Ear: External ear normal.                 Left Ear: External ear normal.                Eyes: . Pupils are equal, round, and reactive to light. Conjunctivae and EOM are normal               Nose: without d/c or deformity               Neck: Neck supple. Gross normal ROM               Cardiovascular: Normal rate and regular rhythm.                 Pulmonary/Chest: Effort normal and breath sounds without rales or wheezing.                Abd:  Soft, NT, ND, + BS, no organomegaly               Neurological: Pt is alert. At baseline orientation, motor grossly intact               Skin: Skin is warm. No rashes, no other new lesions, LE edema - none               Psychiatric: Pt behavior is normal without agitation   Micro: none  Cardiac tracings I have personally interpreted today:  none  Pertinent Radiological findings (summarize): none   Lab Results  Component Value Date   WBC 5.0 12/03/2022   HGB 14.4 12/03/2022   HCT 43.6 12/03/2022   PLT 202.0 12/03/2022   GLUCOSE 83 12/03/2022   CHOL 172 12/03/2022   TRIG 65.0 12/03/2022   HDL 73.40 12/03/2022   LDLCALC 85 12/03/2022   ALT 15 12/03/2022   AST 20 12/03/2022   NA 136 12/03/2022   K 3.9 12/03/2022   CL 102 12/03/2022   CREATININE 0.77 12/03/2022   BUN 10 12/03/2022   CO2 28 12/03/2022   TSH 1.17 12/03/2022   HGBA1C 5.1 12/03/2022   Assessment/Plan:  Kathy Mccoy is a 42 y.o. White or Caucasian [1] female with  has a past medical history of Acute pharyngitis (03/23/2010), ANXIETY  DEPRESSION (01/02/2009), CARDIAC ARRHYTHMIA (01/12/2009), COMMON MIGRAINE (01/02/2009), POTS (postural orthostatic tachycardia syndrome), PREMATURE VENTRICULAR CONTRACTIONS (02/21/2009), SINUSITIS-  ACUTE-NOS (08/15/2009), and SYNCOPE (01/02/2009).  Encounter for well adult exam with abnormal findings Age and sex appropriate education and counseling updated with regular exercise and diet Referrals for preventative services - none needed Immunizations addressed - for flu shot at pharmacy, declines covid booster Smoking counseling  - none needed Evidence for depression or other mood disorder - none significant Most recent labs reviewed. I have personally reviewed and have noted: 1) the patient's medical and social history 2) The patient's current medications and supplements 3) The patient's height, weight, and BMI have been recorded in the chart   Hyperglycemia Lab Results  Component Value Date   HGBA1C 5.1 12/03/2022   Stable, pt to continue current medical treatment  - diet, wt control   Right leg DVT (HCC) Now s/p f/u right knee surgury, ok to d/c eliquis  Right knee pain For f/u ortho, and volt gel prn  Low back pain ? Sacroilitis - for volt gel prn, f/u ortho as planned  B12 deficiency Lab Results  Component Value Date   VITAMINB12 256 12/03/2022   Low, to start oral replacement - b12 1000 mcg qd   Followup: Return in about 1 year (around 12/03/2023).  Oliver Barre, MD 12/06/2022 9:28 PM [facility]

## 2022-12-03 NOTE — Patient Instructions (Addendum)
Ok to stop the [employer] to use the OTC Voltaren gel for the back pain, and see ortho for this if persists  Please continue all other medications as before, and refills have been done if requested - the steroid cream  Please have the pharmacy call with any other refills you may need.  Please continue your efforts at being more active, low cholesterol diet, and weight control.  You are otherwise up to date with prevention measures today.  Please keep your appointments with your specialists as you may have planned  Please see our office manager regarding the administrative issue with the blood test coverage  Please go to the LAB at the blood drawing area for the tests to be done  You will be contacted by phone if any changes need to be made immediately.  Otherwise, you will receive a letter about your results with an explanation, but please check with MyChart first.  Please remember to sign up for MyChart if you have not done so, as this will be important to you in the future with finding out test results, communicating by private email, and scheduling acute appointments online when needed.  Please make an Appointment to return for your 1 year visit, or sooner if needed

## 2022-12-03 NOTE — Progress Notes (Signed)
The test results show that your current treatment is OK, as the tests are stable.  Please continue the same plan.  There is no other need for change of treatment or further evaluation based on these results, at this time.  thanks 

## 2022-12-06 ENCOUNTER — Encounter: Payer: Self-pay | Admitting: Internal Medicine

## 2022-12-06 DIAGNOSIS — Z0001 Encounter for general adult medical examination with abnormal findings: Secondary | ICD-10-CM

## 2022-12-06 DIAGNOSIS — E538 Deficiency of other specified B group vitamins: Secondary | ICD-10-CM | POA: Insufficient documentation

## 2022-12-06 DIAGNOSIS — M545 Low back pain, unspecified: Secondary | ICD-10-CM | POA: Insufficient documentation

## 2022-12-06 HISTORY — DX: Encounter for general adult medical examination with abnormal findings: Z00.01

## 2022-12-06 NOTE — Assessment & Plan Note (Signed)
For f/u ortho, and volt gel prn

## 2022-12-06 NOTE — Assessment & Plan Note (Signed)
Lab Results  Component Value Date   VITAMINB12 256 12/03/2022   Low, to start oral replacement - b12 1000 mcg qd

## 2022-12-06 NOTE — Assessment & Plan Note (Signed)
Age and sex appropriate education and counseling updated with regular exercise and diet Referrals for preventative services - none needed Immunizations addressed - for flu shot at pharmacy, declines covid booster Smoking counseling  - none needed Evidence for depression or other mood disorder - none significant Most recent labs reviewed. I have personally reviewed and have noted: 1) the patient's medical and social history 2) The patient's current medications and supplements 3) The patient's height, weight, and BMI have been recorded in the chart

## 2022-12-06 NOTE — Assessment & Plan Note (Signed)
?   Sacroilitis - for volt gel prn, f/u ortho as planned

## 2022-12-06 NOTE — Assessment & Plan Note (Signed)
Now s/p f/u right knee surgury, ok to d/c eliquis

## 2022-12-06 NOTE — Assessment & Plan Note (Signed)
Lab Results  Component Value Date   HGBA1C 5.1 12/03/2022   Stable, pt to continue current medical treatment  - diet, wt control

## 2022-12-08 DIAGNOSIS — M222X1 Patellofemoral disorders, right knee: Secondary | ICD-10-CM | POA: Diagnosis not present

## 2022-12-08 DIAGNOSIS — M949 Disorder of cartilage, unspecified: Secondary | ICD-10-CM | POA: Diagnosis not present

## 2022-12-10 DIAGNOSIS — M222X1 Patellofemoral disorders, right knee: Secondary | ICD-10-CM | POA: Diagnosis not present

## 2022-12-10 DIAGNOSIS — M949 Disorder of cartilage, unspecified: Secondary | ICD-10-CM | POA: Diagnosis not present

## 2022-12-11 DIAGNOSIS — M533 Sacrococcygeal disorders, not elsewhere classified: Secondary | ICD-10-CM | POA: Diagnosis not present

## 2022-12-17 DIAGNOSIS — M949 Disorder of cartilage, unspecified: Secondary | ICD-10-CM | POA: Diagnosis not present

## 2022-12-17 DIAGNOSIS — M222X1 Patellofemoral disorders, right knee: Secondary | ICD-10-CM | POA: Diagnosis not present

## 2022-12-23 DIAGNOSIS — M222X1 Patellofemoral disorders, right knee: Secondary | ICD-10-CM | POA: Diagnosis not present

## 2022-12-23 DIAGNOSIS — S336XXD Sprain of sacroiliac joint, subsequent encounter: Secondary | ICD-10-CM | POA: Diagnosis not present

## 2022-12-24 DIAGNOSIS — M533 Sacrococcygeal disorders, not elsewhere classified: Secondary | ICD-10-CM | POA: Diagnosis not present

## 2022-12-24 NOTE — Telephone Encounter (Signed)
Unfortunately I dont have anything else to add, so this would not be helpful  thanks

## 2022-12-24 NOTE — Telephone Encounter (Signed)
Patient's insurance states that a peer to peer review needs to be done because Prior authroization was not sent in until after the test had already been done.  4 month's later.  Please call  [phone]  Order ID #  [id number]

## 2023-01-02 DIAGNOSIS — M949 Disorder of cartilage, unspecified: Secondary | ICD-10-CM | POA: Diagnosis not present

## 2023-01-02 DIAGNOSIS — M222X1 Patellofemoral disorders, right knee: Secondary | ICD-10-CM | POA: Diagnosis not present

## 2023-01-07 DIAGNOSIS — M222X1 Patellofemoral disorders, right knee: Secondary | ICD-10-CM | POA: Diagnosis not present

## 2023-01-07 DIAGNOSIS — M949 Disorder of cartilage, unspecified: Secondary | ICD-10-CM | POA: Diagnosis not present

## 2023-01-21 DIAGNOSIS — M949 Disorder of cartilage, unspecified: Secondary | ICD-10-CM | POA: Diagnosis not present

## 2023-01-21 DIAGNOSIS — M222X1 Patellofemoral disorders, right knee: Secondary | ICD-10-CM | POA: Diagnosis not present

## 2023-01-22 DIAGNOSIS — M545 Low back pain, unspecified: Secondary | ICD-10-CM | POA: Diagnosis not present

## 2023-01-29 ENCOUNTER — Other Ambulatory Visit: Payer: Self-pay | Admitting: Internal Medicine

## 2023-01-29 DIAGNOSIS — M545 Low back pain, unspecified: Secondary | ICD-10-CM | POA: Diagnosis not present

## 2023-01-30 ENCOUNTER — Other Ambulatory Visit: Payer: Self-pay

## 2023-02-12 DIAGNOSIS — M5416 Radiculopathy, lumbar region: Secondary | ICD-10-CM | POA: Diagnosis not present

## 2023-02-16 DIAGNOSIS — M5416 Radiculopathy, lumbar region: Secondary | ICD-10-CM | POA: Diagnosis not present

## 2023-03-08 ENCOUNTER — Other Ambulatory Visit: Payer: Self-pay | Admitting: Internal Medicine

## 2023-03-09 NOTE — Telephone Encounter (Signed)
Requested by interface surescripts. Provider not at this practice.  Requested Prescriptions  Refused Prescriptions Disp Refills   ELIQUIS 5 MG TABS tablet [Pharmacy Med Name: ELIQUIS 5MG  TABLETS] 180 tablet 1    Sig: TAKE 1 TABLET(5 MG) BY MOUTH TWICE DAILY     There is no refill protocol information for this order

## 2023-03-31 DIAGNOSIS — M222X1 Patellofemoral disorders, right knee: Secondary | ICD-10-CM | POA: Diagnosis not present

## 2023-05-19 ENCOUNTER — Other Ambulatory Visit: Payer: Self-pay | Admitting: Internal Medicine

## 2023-06-30 DIAGNOSIS — M5416 Radiculopathy, lumbar region: Secondary | ICD-10-CM | POA: Diagnosis not present

## 2023-07-08 DIAGNOSIS — M5416 Radiculopathy, lumbar region: Secondary | ICD-10-CM | POA: Diagnosis not present

## 2023-08-31 DIAGNOSIS — Z113 Encounter for screening for infections with a predominantly sexual mode of transmission: Secondary | ICD-10-CM | POA: Diagnosis not present

## 2023-08-31 DIAGNOSIS — Z01419 Encounter for gynecological examination (general) (routine) without abnormal findings: Secondary | ICD-10-CM | POA: Diagnosis not present

## 2023-08-31 DIAGNOSIS — Z124 Encounter for screening for malignant neoplasm of cervix: Secondary | ICD-10-CM | POA: Diagnosis not present

## 2023-09-21 DIAGNOSIS — Z1231 Encounter for screening mammogram for malignant neoplasm of breast: Secondary | ICD-10-CM | POA: Diagnosis not present

## 2023-10-14 ENCOUNTER — Telehealth (INDEPENDENT_AMBULATORY_CARE_PROVIDER_SITE_OTHER): Admitting: Internal Medicine

## 2023-10-14 DIAGNOSIS — R739 Hyperglycemia, unspecified: Secondary | ICD-10-CM | POA: Diagnosis not present

## 2023-10-14 DIAGNOSIS — R062 Wheezing: Secondary | ICD-10-CM

## 2023-10-14 DIAGNOSIS — R051 Acute cough: Secondary | ICD-10-CM | POA: Diagnosis not present

## 2023-10-14 MED ORDER — GUAIFENESIN ER 600 MG PO TB12: 1200.0000 mg | ORAL_TABLET | Freq: Two times a day (BID) | ORAL | 1 refills | Status: DC | PRN

## 2023-10-14 MED ORDER — AZITHROMYCIN 250 MG PO TABS: ORAL_TABLET | ORAL | 1 refills | Status: AC

## 2023-10-14 MED ORDER — HYDROCODONE BIT-HOMATROP MBR 5-1.5 MG/5ML PO SOLN: 5.0000 mL | Freq: Four times a day (QID) | ORAL | 0 refills | Status: AC | PRN

## 2023-10-14 MED ORDER — PREDNISONE 10 MG PO TABS: ORAL_TABLET | ORAL | 0 refills | Status: DC

## 2023-10-14 NOTE — Progress Notes (Signed)
 Patient ID: Kathy Mccoy, female   DOB: 10-Mar-1981, 43 y.o.   MRN: [id number]  Virtual Visit via Video Note  I connected with Kathy Mccoy on 10/14/23 at 10:20 AM EDT by a video enabled telemedicine application and verified that I am speaking with the correct person using two identifiers.  Location of all participants today Patient: at home Provider: at office   I discussed the limitations of evaluation and management by telemedicine and the availability of in person appointments. The patient expressed understanding and agreed to proceed.  History of Present Illness: Here with acute onset mild to mod 4days ST, HA, general weakness and malaise, with prod cough greenish sputum, but Pt denies chest pain, increased sob or doe, wheezing, orthopnea, PND, increased LE swelling, palpitations, dizziness or syncope except onset mild wheezing sob since yesterday getting worse.   Pt denies polydipsia, polyuria, or new focal neuro s/s.    Pt denies fever, wt loss, night sweats, loss of appetite, or other constitutional symptoms   Past Medical History:  Diagnosis Date   Acute pharyngitis 03/23/2010   ANXIETY DEPRESSION 01/02/2009   CARDIAC ARRHYTHMIA 01/12/2009   COMMON MIGRAINE 01/02/2009   POTS (postural orthostatic tachycardia syndrome)    PREMATURE VENTRICULAR CONTRACTIONS 02/21/2009   SINUSITIS- ACUTE-NOS 08/15/2009   SYNCOPE 01/02/2009   Past Surgical History:  Procedure Laterality Date   CHONDROPLASTY Right 05/15/2022   Procedure: CHONDROPLASTY, AND CARTILAGE BIOPSY;  Surgeon: Cristy Bonner DASEN, MD;  Location: [facility];  Service: Orthopedics;  Laterality: Right;   FASCIOTOMY Right 10/02/2022   Procedure: FASCIOTOMY;  Surgeon: Cristy Bonner DASEN, MD;  Location: [facility];  Service: Orthopedics;  Laterality: Right;   FULKERSON SLIDE Right 10/02/2022   Procedure: TIBIAL TUBERCLE TRANSFER;  Surgeon: Cristy Bonner DASEN, MD;  Location: [facility];  Service:  Orthopedics;  Laterality: Right;   KNEE ARTHROSCOPY     KNEE ARTHROSCOPY Right 05/15/2022   Procedure: ARTHROSCOPY KNEE/diagnostic, lLOOSE BODY EXCISION;  Surgeon: Cristy Bonner DASEN, MD;  Location: [facility];  Service: Orthopedics;  Laterality: Right;   s/p rhinoplasty  04/22/2007   SYNOVECTOMY Right 10/02/2022   Procedure: SYNOVECTOMY;  Surgeon: Cristy Bonner DASEN, MD;  Location: [facility];  Service: Orthopedics;  Laterality: Right;    reports that she quit smoking about 6 years ago. Her smoking use included cigarettes. She has never used smokeless tobacco. She reports current alcohol use. She reports that she does not currently use drugs after having used the following drugs: Marijuana. family history includes Breast cancer in her maternal grandmother; Hyperlipidemia in her father. Allergies  Allergen Reactions   Moxifloxacin Rash and Hives   Current Outpatient Medications on File Prior to Visit  Medication Sig Dispense Refill   Betamethasone  Valerate 0.12 % foam Apply 1 Dose topically 2 (two) times daily. 300 g 3   celecoxib  (CELEBREX ) 200 MG capsule Take 200 mg by mouth 2 (two) times daily.     diazepam  (VALIUM ) 5 MG tablet TAKE 1 TABLET(5 MG) BY MOUTH DAILY AS NEEDED FOR ANXIETY 30 tablet 2   levonorgestrel  (MIRENA ) 20 MCG/24HR IUD 1 each by Intrauterine route once.     oxyCODONE -acetaminophen  (PERCOCET/ROXICET) 5-325 MG tablet Take 1 tablet by mouth every 6 (six) hours as needed for severe pain. 10 tablet 0   rizatriptan  (MAXALT ) 10 MG tablet TAKE ONE TABLET BY MOUTH AND MAY RPEAT IN 2 HOURS IF NEEDED. 10 tablet 11   No current facility-administered medications on file prior  to visit.    Observations/Objective: Alert, mild ill, appropriate mood and affect, resps normal, cn 2-12 intact, moves all 4s, no visible rash or swelling Lab Results  Component Value Date   WBC 5.0 12/03/2022   HGB 14.4 12/03/2022   HCT 43.6 12/03/2022   PLT 202.0 12/03/2022   GLUCOSE  83 12/03/2022   CHOL 172 12/03/2022   TRIG 65.0 12/03/2022   HDL 73.40 12/03/2022   LDLCALC 85 12/03/2022   ALT 15 12/03/2022   AST 20 12/03/2022   NA 136 12/03/2022   K 3.9 12/03/2022   CL 102 12/03/2022   CREATININE 0.77 12/03/2022   BUN 10 12/03/2022   CO2 28 12/03/2022   TSH 1.17 12/03/2022   HGBA1C 5.1 12/03/2022   Assessment and Plan: See notes  Follow Up Instructions: See notes   I discussed the assessment and treatment plan with the patient. The patient was provided an opportunity to ask questions and all were answered. The patient agreed with the plan and demonstrated an understanding of the instructions.   The patient was advised to call back or seek an in-person evaluation if the symptoms worsen or if the condition fails to improve as anticipated.    Johnwilliam Shepperson, MD\

## 2023-10-17 ENCOUNTER — Encounter: Payer: Self-pay | Admitting: Internal Medicine

## 2023-10-17 DIAGNOSIS — R059 Cough, unspecified: Secondary | ICD-10-CM | POA: Insufficient documentation

## 2023-10-17 DIAGNOSIS — R062 Wheezing: Secondary | ICD-10-CM

## 2023-10-17 HISTORY — DX: Cough, unspecified: R05.9

## 2023-10-17 HISTORY — DX: Wheezing: R06.2

## 2023-10-17 NOTE — Assessment & Plan Note (Signed)
Mild to mod, for antibx course zpack, cough med prn,,  to f/u any worsening symptoms or concerns 

## 2023-10-17 NOTE — Patient Instructions (Signed)
 Please take all new medication as prescribed

## 2023-10-17 NOTE — Assessment & Plan Note (Signed)
 Mild to mod, for prednisone taper, to f/u any worsening symptoms or concerns

## 2023-10-19 ENCOUNTER — Encounter: Payer: Self-pay | Admitting: Internal Medicine

## 2023-10-19 MED ORDER — FLUCONAZOLE 150 MG PO TABS: ORAL_TABLET | ORAL | 1 refills | Status: DC

## 2023-10-20 DIAGNOSIS — N9089 Other specified noninflammatory disorders of vulva and perineum: Secondary | ICD-10-CM | POA: Diagnosis not present

## 2023-10-20 DIAGNOSIS — A6004 Herpesviral vulvovaginitis: Secondary | ICD-10-CM | POA: Diagnosis not present

## 2023-10-20 DIAGNOSIS — Z113 Encounter for screening for infections with a predominantly sexual mode of transmission: Secondary | ICD-10-CM | POA: Diagnosis not present

## 2023-11-25 ENCOUNTER — Other Ambulatory Visit: Payer: Self-pay | Admitting: Internal Medicine

## 2024-01-13 ENCOUNTER — Telehealth: Admitting: Internal Medicine

## 2024-01-13 ENCOUNTER — Telehealth: Payer: Self-pay | Admitting: Internal Medicine

## 2024-01-13 DIAGNOSIS — J069 Acute upper respiratory infection, unspecified: Secondary | ICD-10-CM | POA: Diagnosis not present

## 2024-01-13 DIAGNOSIS — R739 Hyperglycemia, unspecified: Secondary | ICD-10-CM | POA: Diagnosis not present

## 2024-01-13 DIAGNOSIS — J309 Allergic rhinitis, unspecified: Secondary | ICD-10-CM | POA: Diagnosis not present

## 2024-01-13 MED ORDER — AZITHROMYCIN 250 MG PO TABS: ORAL_TABLET | ORAL | 1 refills | Status: AC

## 2024-01-13 MED ORDER — PREDNISONE 10 MG PO TABS: ORAL_TABLET | ORAL | 0 refills | Status: DC

## 2024-01-13 MED ORDER — HYDROCODONE BIT-HOMATROP MBR 5-1.5 MG/5ML PO SOLN: 5.0000 mL | Freq: Four times a day (QID) | ORAL | 0 refills | Status: AC | PRN

## 2024-01-13 NOTE — Telephone Encounter (Signed)
 Copied from CRM #[id number]. Topic: Clinical - Medication Refill >> Jan 13, 2024 11:41 AM Jasmin G wrote: Medication: predniSONE  (DELTASONE ) 10 MG tablet  Has the patient contacted their pharmacy? Yes (Agent: If no, request that the patient contact the pharmacy for the refill. If patient does not wish to contact the pharmacy document the reason why and proceed with request.) (Agent: If yes, when and what did the pharmacy advise?)  This is the patient's preferred pharmacy:  [id number] [address], [place] - [address] AT [facility] OF [address] & [facility] [address] [place] [address] Phone: [phone] Fax: [phone]  Is this the correct pharmacy for this prescription? Yes If no, delete pharmacy and type the correct one.   Has the prescription been filled recently? Yes  Is the patient out of the medication? Yes  Has the patient been seen for an appointment in the last year OR does the patient have an upcoming appointment? Yes  Can we respond through MyChart? No  Agent: Please be advised that Rx refills may take up to 3 business days. We ask that you follow-up with your pharmacy.

## 2024-01-13 NOTE — Progress Notes (Unsigned)
 Patient ID: Kathy Mccoy, female   DOB: 30-Mar-1981, 43 y.o.   MRN: [id number]  Virtual Visit via Video Note  I connected with Kathy Mccoy on 01/15/24 at 11:00 AM EDT by a video enabled telemedicine application and verified that I am speaking with the correct person using two identifiers.  Location of all participants today Patient: at home Provider: at office   I discussed the limitations of evaluation and management by telemedicine and the availability of in person appointments. The patient expressed understanding and agreed to proceed.  History of Present Illness:  Here with 4 days acute onset fever, facial pain, pressure, headache, general weakness and malaise, and greenish d/c, with mild ST and cough, but pt denies chest pain, wheezing, increased sob or doe, orthopnea, PND, increased LE swelling, palpitations, dizziness or syncope. Pt tested neg for Covid at home yesterday,  Sudafed otc not helping.  Not pregnant, has IUD.  Does have several wks ongoing nasal allergy symptoms with clearish congestion, itch and sneezing, without fever, pain, ST, cough, swelling or wheezing. Past Medical History:  Diagnosis Date   Acute pharyngitis 03/23/2010   ANXIETY DEPRESSION 01/02/2009   CARDIAC ARRHYTHMIA 01/12/2009   COMMON MIGRAINE 01/02/2009   POTS (postural orthostatic tachycardia syndrome)    PREMATURE VENTRICULAR CONTRACTIONS 02/21/2009   SINUSITIS- ACUTE-NOS 08/15/2009   SYNCOPE 01/02/2009   Past Surgical History:  Procedure Laterality Date   CHONDROPLASTY Right 05/15/2022   Procedure: CHONDROPLASTY, AND CARTILAGE BIOPSY;  Surgeon: Cristy Bonner DASEN, MD;  Location: [facility];  Service: Orthopedics;  Laterality: Right;   FASCIOTOMY Right 10/02/2022   Procedure: FASCIOTOMY;  Surgeon: Cristy Bonner DASEN, MD;  Location: [facility];  Service: Orthopedics;  Laterality: Right;   FULKERSON SLIDE Right 10/02/2022   Procedure: TIBIAL TUBERCLE TRANSFER;  Surgeon: Cristy Bonner DASEN,  MD;  Location: [facility];  Service: Orthopedics;  Laterality: Right;   KNEE ARTHROSCOPY     KNEE ARTHROSCOPY Right 05/15/2022   Procedure: ARTHROSCOPY KNEE/diagnostic, lLOOSE BODY EXCISION;  Surgeon: Cristy Bonner DASEN, MD;  Location: [facility];  Service: Orthopedics;  Laterality: Right;   s/p rhinoplasty  04/22/2007   SYNOVECTOMY Right 10/02/2022   Procedure: SYNOVECTOMY;  Surgeon: Cristy Bonner DASEN, MD;  Location: [facility];  Service: Orthopedics;  Laterality: Right;    reports that she quit smoking about 6 years ago. Her smoking use included cigarettes. She has never used smokeless tobacco. She reports current alcohol use. She reports that she does not currently use drugs after having used the following drugs: Marijuana. family history includes Breast cancer in her maternal grandmother; Hyperlipidemia in her father. Allergies  Allergen Reactions   Moxifloxacin Rash and Hives   Current Outpatient Medications on File Prior to Visit  Medication Sig Dispense Refill   Betamethasone  Valerate 0.12 % foam Apply 1 Dose topically 2 (two) times daily. 300 g 3   celecoxib  (CELEBREX ) 200 MG capsule Take 200 mg by mouth 2 (two) times daily.     diazepam  (VALIUM ) 5 MG tablet TAKE 1 TABLET(5 MG) BY MOUTH DAILY AS NEEDED FOR ANXIETY 30 tablet 0   fluconazole  (DIFLUCAN ) 150 MG tablet 1 tab by mouth twice per day as needed 2 tablet 1   guaiFENesin  (MUCINEX ) 600 MG 12 hr tablet Take 2 tablets (1,200 mg total) by mouth 2 (two) times daily as needed. 60 tablet 1   levonorgestrel  (MIRENA ) 20 MCG/24HR IUD 1 each by Intrauterine route once.     oxyCODONE -acetaminophen  (PERCOCET/ROXICET)  5-325 MG tablet Take 1 tablet by mouth every 6 (six) hours as needed for severe pain. 10 tablet 0   predniSONE  (DELTASONE ) 10 MG tablet 3 tabs by mouth per day for 3 days,2tabs per day for 3 days,1tab per day for 3 days 18 tablet 0   rizatriptan  (MAXALT ) 10 MG tablet TAKE ONE TABLET BY MOUTH AND  MAY RPEAT IN 2 HOURS IF NEEDED. 10 tablet 11   No current facility-administered medications on file prior to visit.    Observations/Objective: Alert, NAD, appropriate mood and affect, resps normal, cn 2-12 intact, moves all 4s, no visible rash or swelling Lab Results  Component Value Date   WBC 5.0 12/03/2022   HGB 14.4 12/03/2022   HCT 43.6 12/03/2022   PLT 202.0 12/03/2022   GLUCOSE 83 12/03/2022   CHOL 172 12/03/2022   TRIG 65.0 12/03/2022   HDL 73.40 12/03/2022   LDLCALC 85 12/03/2022   ALT 15 12/03/2022   AST 20 12/03/2022   NA 136 12/03/2022   K 3.9 12/03/2022   CL 102 12/03/2022   CREATININE 0.77 12/03/2022   BUN 10 12/03/2022   CO2 28 12/03/2022   TSH 1.17 12/03/2022   HGBA1C 5.1 12/03/2022   Assessment and Plan: See notes  Follow Up Instructions: See notes   I discussed the assessment and treatment plan with the patient. The patient was provided an opportunity to ask questions and all were answered. The patient agreed with the plan and demonstrated an understanding of the instructions.   The patient was advised to call back or seek an in-person evaluation if the symptoms worsen or if the condition fails to improve as anticipated.   Lynwood Rush, MD

## 2024-01-13 NOTE — Telephone Encounter (Signed)
 Sent to pharmacy today

## 2024-01-15 ENCOUNTER — Encounter: Payer: Self-pay | Admitting: Internal Medicine

## 2024-01-15 DIAGNOSIS — J069 Acute upper respiratory infection, unspecified: Secondary | ICD-10-CM

## 2024-01-15 DIAGNOSIS — J309 Allergic rhinitis, unspecified: Secondary | ICD-10-CM | POA: Insufficient documentation

## 2024-01-15 HISTORY — DX: Acute upper respiratory infection, unspecified: J06.9

## 2024-01-15 NOTE — Assessment & Plan Note (Signed)
Mild to mod, for antibx course zpack, cough med prn,  to f/u any worsening symptoms or concerns 

## 2024-01-15 NOTE — Assessment & Plan Note (Signed)
Lab Results  Component Value Date   HGBA1C 5.1 12/03/2022   Stable, pt to continue current medical treatment  - diet, wt control

## 2024-01-15 NOTE — Patient Instructions (Signed)
 Please take all new medication as prescribed

## 2024-01-15 NOTE — Assessment & Plan Note (Signed)
 Also for prednisone  taper asd, mild to mod,  to f/u any worsening symptoms or concerns

## 2024-01-22 NOTE — Telephone Encounter (Signed)
 Error

## 2024-01-29 ENCOUNTER — Other Ambulatory Visit: Payer: Self-pay | Admitting: Internal Medicine

## 2024-03-03 ENCOUNTER — Other Ambulatory Visit: Payer: Self-pay | Admitting: Internal Medicine

## 2024-03-09 ENCOUNTER — Other Ambulatory Visit: Payer: Self-pay | Admitting: Internal Medicine

## 2024-03-10 ENCOUNTER — Other Ambulatory Visit: Payer: Self-pay

## 2024-04-19 ENCOUNTER — Telehealth: Payer: Self-pay

## 2024-04-19 NOTE — Telephone Encounter (Signed)
 Copied from CRM #[id number]. Topic: Appointments - Scheduling Inquiry for Clinic >> Apr 19, 2024 11:46 AM Mesmerise C wrote: Reason for CRM: Patient would like to start being seen by Dr.Parker due to her provider Dr. Norleen is retiring, Dr. Kennyth sees her son Johnetta now and would like him to accept her as a new patient and seen he was accepting new patients online but on kms shows he isn't, if can be taken on as a new pt please call her back to schedule TOC appt

## 2024-04-19 NOTE — Telephone Encounter (Signed)
 Ok with me

## 2024-04-20 NOTE — Telephone Encounter (Signed)
 See message okay to transfer.

## 2024-04-25 NOTE — Telephone Encounter (Signed)
 LVM to schedule TOC with Kathy Mccoy. Due to provider not open for TOC or New Pts, please transfer to [facility]

## 2024-05-17 ENCOUNTER — Ambulatory Visit: Admitting: Family

## 2024-05-17 ENCOUNTER — Encounter: Payer: Self-pay | Admitting: Family

## 2024-05-17 VITALS — BP 122/78 | HR 83 | Temp 98.4°F | Ht 67.0 in | Wt 124.6 lb

## 2024-05-17 DIAGNOSIS — F411 Generalized anxiety disorder: Secondary | ICD-10-CM

## 2024-05-17 DIAGNOSIS — L219 Seborrheic dermatitis, unspecified: Secondary | ICD-10-CM

## 2024-05-17 MED ORDER — KETOCONAZOLE 2 % EX CREA: TOPICAL_CREAM | Freq: Every day | CUTANEOUS | 3 refills | Status: AC | PRN

## 2024-05-17 NOTE — Patient Instructions (Addendum)
 Welcome to [facility], It was a pleasure meeting you today!    As discussed, I have sent your refill to your pharmacy.  Please schedule a physical with fasting labs when convenient!    PLEASE NOTE: If you had any LAB tests please let us  know if you have not heard back within a few days. You may see your results on MyChart before we have a chance to review them but we will give you a call once they are reviewed by us . If we ordered any REFERRALS today, please let us  know if you have not heard from their office within the next week.  Let us  know through MyChart if you are needing REFILLS, or have your pharmacy send us  the request. You can also use MyChart to communicate with me or any office staff.  Please try these tips to maintain a healthy lifestyle: It is important that you exercise regularly at least 30 minutes 5 times a week. Think about what you will eat, plan ahead. Choose whole foods, & think  clean, green, fresh or frozen over canned, processed or packaged foods which are more sugary, salty, and fatty. 70 to 75% of food eaten should be fresh vegetables and protein. 2-3  meals daily with healthy snacks between meals, but must be whole fruit, protein or vegetables. Aim to eat over a 10 hour period when you are active, for example, 7am to 5pm, and then STOP after your last meal of the day, drinking only water.  Shorter eating windows, 6-8 hours, are showing benefits in heart disease and blood sugar regulation. Drink water every day! Shoot for 64 ounces daily = 8 cups, no other drink is as healthy! Fruit juice is best enjoyed in a healthy way, by EATING the fruit.

## 2024-05-17 NOTE — Progress Notes (Signed)
 "  New Patient Office Visit  Subjective:  Patient ID: Kathy Mccoy, female    DOB: 06/26/1980  Age: 44 y.o. MRN: [id number]  CC:  Chief Complaint  Patient presents with   Transitions Of Care    PCP is retiring. Here for a TOC. No questions or concerns. Does need a refill meds.    HPI Kathy Mccoy presents for establishing care today. Discussed the use of AI scribe software for clinical note transcription with the patient, who gave verbal consent to proceed.  History of Present Illness Kathy Mccoy is a 44 year old female who presents for establishment of care after her primary care physician retired.  She has anxiety with possible obsessive-compulsive traits, including compulsive organizing and significant germ concerns. She takes Valium  daily after breakfast for the past two years, which reduces anxiety. She previously tried Paxil  without benefit and has not previously seen a mental health provider or therapist. She has POTS diagnosed after years of misdiagnosis, with dizziness and past fainting episodes. She manages by sitting when dizzy and has not fainted in about five to six years. She has had weight fluctuations and was previously comfortable at 140 pounds when working with a psychologist, educational. She has anxiety about weight loss and appetite, which affects her eating. 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 She recently lost her job and is now interviewing, with plans to return to an office setting after working from home for seven years. She has chronic back pain related to L4 and has received multiple injections for pain. She takes two Valium  pills before these procedures to stay relaxed. She notes small white spots on the backs of her arms that respond to a topical cream and a stable cyst present for years without change.  Assessment & Plan Generalized anxiety disorder Chronic anxiety managed with Valium  5mg  qd, concerns about long-term use  and risks. Paxil  ineffective. Discussed Buspar as an alternative, she will research further. - Continue Valium  as needed, consider reducing to half a pill if possible. - Research Buspar as an alternative treatment for anxiety. - Consider trial of Buspar 5-10mg  2-3x/day.  - F/U prn  Seborrheic dermatitis Chronic condition managed with Nizoral  cream. - Continue Nizoral  cream for seborrheic dermatitis, refill sent. - F/U prn  Postural orthostatic tachycardia syndrome (POTS) POTS well-managed with lifestyle modifications, no medication needed due to infrequent episodes. - Continue lifestyle modifications including hydration and avoiding rapid position changes. - Encouraged low-impact exercise and gradual increase in activity level.   Subjective:    Outpatient Medications Prior to Visit  Medication Sig Dispense Refill   Betamethasone  Valerate 0.12 % foam Apply 1 Dose topically 2 (two) times daily. (Patient taking differently: Apply 1 Dose topically as needed.) 300 g 3   diazepam  (VALIUM ) 5 MG tablet TAKE 1 TABLET(5 MG) BY MOUTH DAILY AS NEEDED FOR ANXIETY 30 tablet 2   levonorgestrel  (MIRENA ) 20 MCG/24HR IUD 1 each by Intrauterine route once.     rizatriptan  (MAXALT ) 10 MG tablet TAKE ONE TABLET BY MOUTH AND MAY RPEAT IN 2 HOURS IF NEEDED. 10 tablet 11   ketoconazole  (NIZORAL ) 2 % cream APPLY TO AFFECTED AREA UP TO TWICE DAILY AS NEEDED     celecoxib  (CELEBREX ) 200 MG capsule Take 200 mg by mouth 2 (two) times daily. (Patient not taking: Reported on 05/17/2024)     fluconazole  (DIFLUCAN ) 150 MG tablet 1 tab by mouth twice per day as needed 2 tablet 1   guaiFENesin  (  MUCINEX ) 600 MG 12 hr tablet Take 2 tablets (1,200 mg total) by mouth 2 (two) times daily as needed. 60 tablet 1   oxyCODONE -acetaminophen  (PERCOCET/ROXICET) 5-325 MG tablet Take 1 tablet by mouth every 6 (six) hours as needed for severe pain. 10 tablet 0   predniSONE  (DELTASONE ) 10 MG tablet 3 tabs by mouth per day for 3  days,2tabs per day for 3 days,1tab per day for 3 days (Patient not taking: Reported on 05/17/2024) 18 tablet 0   predniSONE  (DELTASONE ) 10 MG tablet 3 tabs by mouth per day for 3 days,2tabs per day for 3 days,1tab per day for 3 days 18 tablet 0   No facility-administered medications prior to visit.   Past Medical History:  Diagnosis Date   Acute pharyngitis 03/23/2010   Acute upper respiratory infection 01/15/2024   ANXIETY DEPRESSION 01/02/2009   CARDIAC ARRHYTHMIA 01/12/2009   Cardiac dysrhythmia 01/12/2009   Qualifier: Diagnosis of   By: Silver, RN, BSN, Melanie      IMO SNOMED Dx Update Oct 2024     COMMON MIGRAINE 01/02/2009   Cough 10/17/2023   Encounter for well adult exam with abnormal findings 12/06/2022   Hyperglycemia 06/22/2018   POTS (postural orthostatic tachycardia syndrome)    PREMATURE VENTRICULAR CONTRACTIONS 02/21/2009   Preop cardiovascular exam 11/07/2010   Preventative health care 02/05/2020   SINUSITIS- ACUTE-NOS 08/15/2009   Skin lesion of chest wall 02/05/2020   SYNCOPE 01/02/2009   Syncope 04/24/2015   Vaginal candidiasis 02/05/2020   Weight loss 12/07/2021   Wheezing 10/17/2023   Past Surgical History:  Procedure Laterality Date   CHONDROPLASTY Right 05/15/2022   Procedure: CHONDROPLASTY, AND CARTILAGE BIOPSY;  Surgeon: Cristy Bonner DASEN, MD;  Location: [facility];  Service: Orthopedics;  Laterality: Right;   FASCIOTOMY Right 10/02/2022   Procedure: FASCIOTOMY;  Surgeon: Cristy Bonner DASEN, MD;  Location: [facility];  Service: Orthopedics;  Laterality: Right;   FULKERSON SLIDE Right 10/02/2022   Procedure: TIBIAL TUBERCLE TRANSFER;  Surgeon: Cristy Bonner DASEN, MD;  Location: [facility];  Service: Orthopedics;  Laterality: Right;   KNEE ARTHROSCOPY     KNEE ARTHROSCOPY Right 05/15/2022   Procedure: ARTHROSCOPY KNEE/diagnostic, lLOOSE BODY EXCISION;  Surgeon: Cristy Bonner DASEN, MD;  Location: [facility];  Service:  Orthopedics;  Laterality: Right;   s/p rhinoplasty  04/22/2007   SYNOVECTOMY Right 10/02/2022   Procedure: SYNOVECTOMY;  Surgeon: Cristy Bonner DASEN, MD;  Location: [facility];  Service: Orthopedics;  Laterality: Right;    Objective:   Today's Vitals: BP 122/78 (BP Location: Right Arm, Patient Position: Sitting, Cuff Size: Normal)   Pulse 83   Temp 98.4 F (36.9 C) (Temporal)   Ht 5' 7 (1.702 m)   Wt 124 lb 9.6 oz (56.5 kg)   LMP  (Approximate)   SpO2 95%   BMI 19.52 kg/m   Physical Exam Vitals and nursing note reviewed.  Constitutional:      Appearance: Normal appearance.  Cardiovascular:     Rate and Rhythm: Normal rate and regular rhythm.  Pulmonary:     Effort: Pulmonary effort is normal.     Breath sounds: Normal breath sounds.  Musculoskeletal:        General: Normal range of motion.  Skin:    General: Skin is warm and dry.  Neurological:     Mental Status: She is alert.  Psychiatric:        Mood and Affect: Mood normal.  Behavior: Behavior normal.    Meds ordered this encounter  Medications   ketoconazole  (NIZORAL ) 2 % cream    Sig: Apply topically daily as needed for irritation.    Dispense:  15 g    Refill:  3   Merly Hinkson, NP "

## 2024-05-31 ENCOUNTER — Encounter: Admitting: Family Medicine
